# Patient Record
Sex: Female | Born: 1955 | Race: White | Hispanic: No | Marital: Married | Smoking: Former smoker
Health system: Southern US, Community
[De-identification: ages and names within clinical notes are randomized; demographics above are authoritative.]

## PROBLEM LIST (undated history)

## (undated) DIAGNOSIS — I10 Essential (primary) hypertension: Secondary | ICD-10-CM

## (undated) HISTORY — PX: CHOLECYSTECTOMY: SHX55

## (undated) HISTORY — PX: TRIGGER FINGER RELEASE: SHX641

---

## 2000-08-19 HISTORY — PX: ABDOMINAL HYSTERECTOMY: SHX81

## 2005-07-04 LAB — HM PAP SMEAR: HM PAP: NORMAL

## 2006-01-24 ENCOUNTER — Emergency Department: Payer: Self-pay | Admitting: General Practice

## 2008-08-30 ENCOUNTER — Ambulatory Visit: Payer: Self-pay | Admitting: Gastroenterology

## 2008-08-30 LAB — HM COLONOSCOPY

## 2008-11-22 ENCOUNTER — Ambulatory Visit: Payer: Self-pay | Admitting: Gastroenterology

## 2008-11-22 HISTORY — PX: UPPER GI ENDOSCOPY: SHX6162

## 2010-04-26 ENCOUNTER — Ambulatory Visit: Payer: Self-pay | Admitting: Internal Medicine

## 2010-04-27 ENCOUNTER — Inpatient Hospital Stay: Payer: Self-pay | Admitting: Surgery

## 2010-05-03 LAB — PATHOLOGY REPORT

## 2010-12-06 ENCOUNTER — Ambulatory Visit: Payer: Self-pay | Admitting: Family Medicine

## 2011-08-30 IMAGING — US ABDOMEN ULTRASOUND
1 series · 17 of 25 positions shown · non-contrast
Comparison: none

REASON FOR EXAM: CR [id number] Abd Pain Vomiting
COMMENTS:

[Series 1: abdomen ultrasound · 17 of 99 slices shown]
[im 1/99]
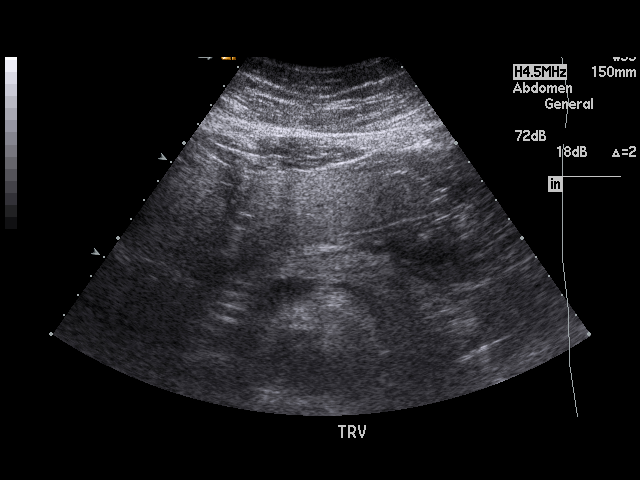
[im 9/99]
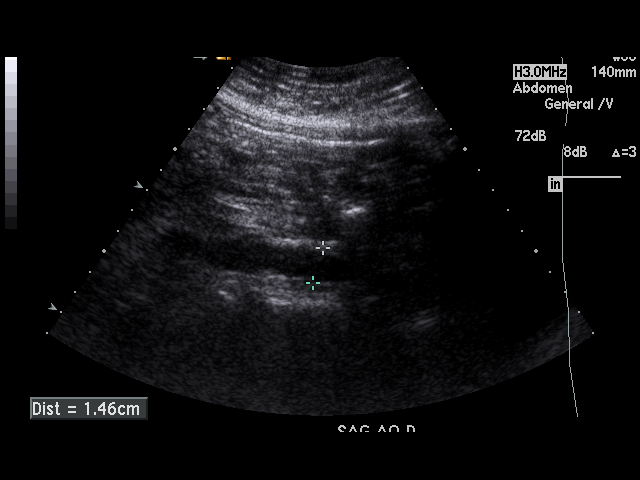
[im 13/99]
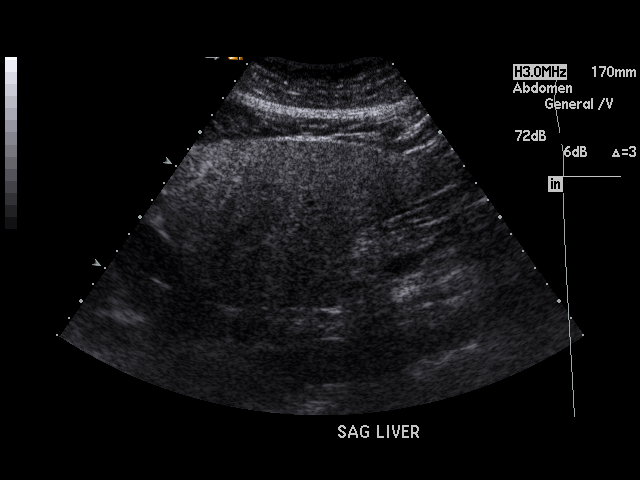
[im 21/99]
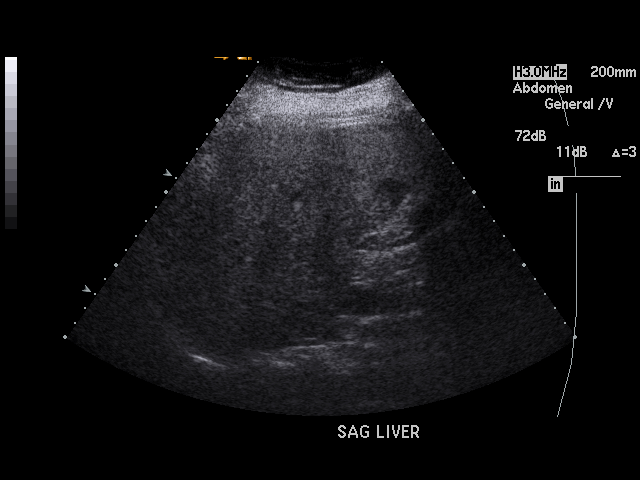
[im 25/99]
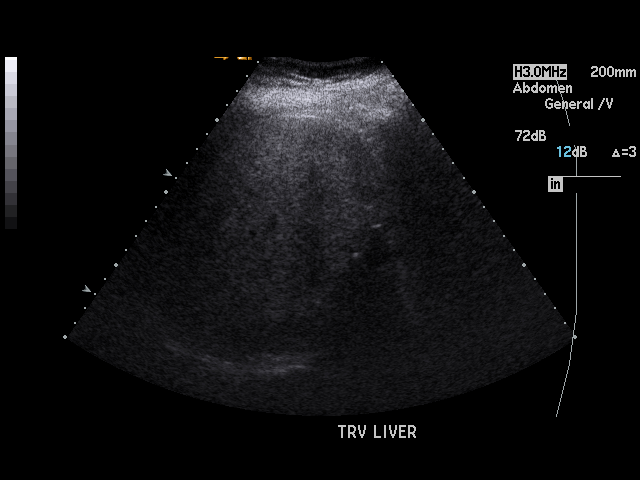
[im 33/99]
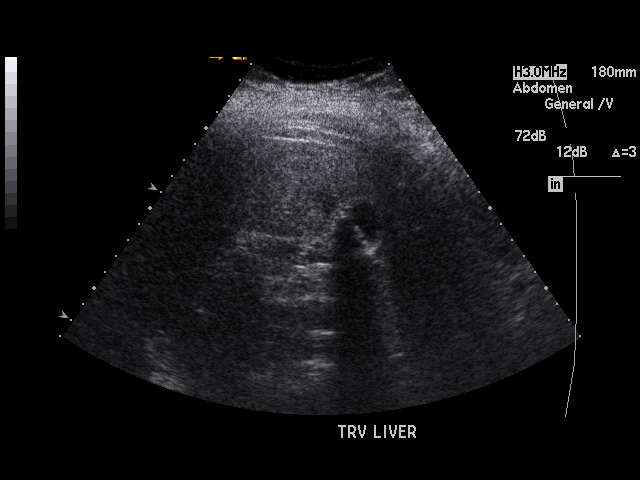
[im 37/99]
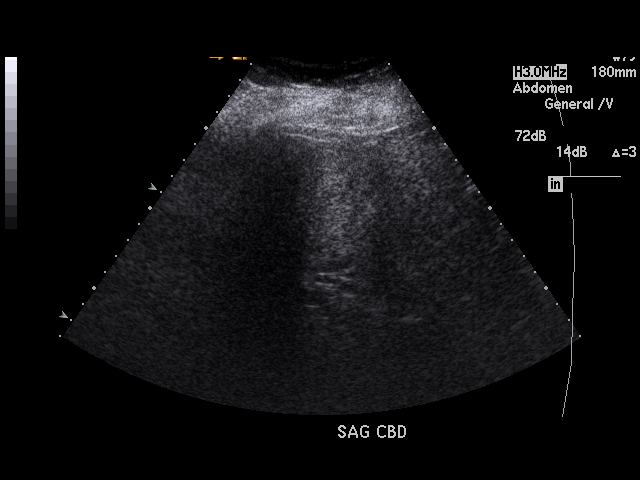
[im 45/99]
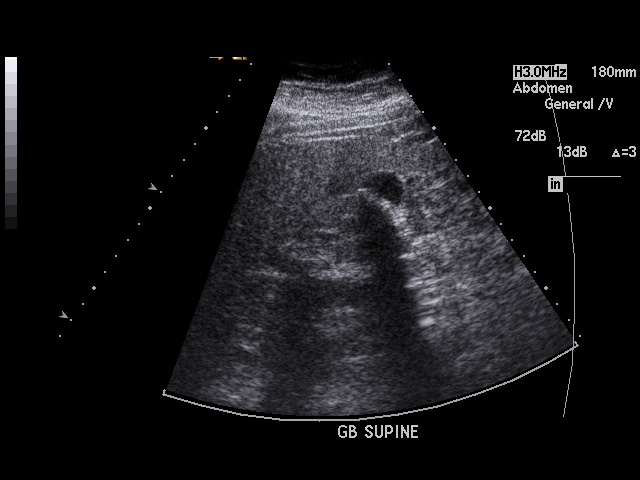
[im 50/99]
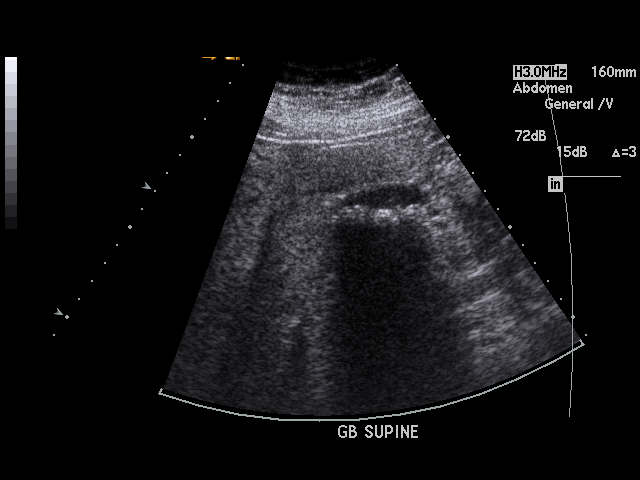
[im 54/99]
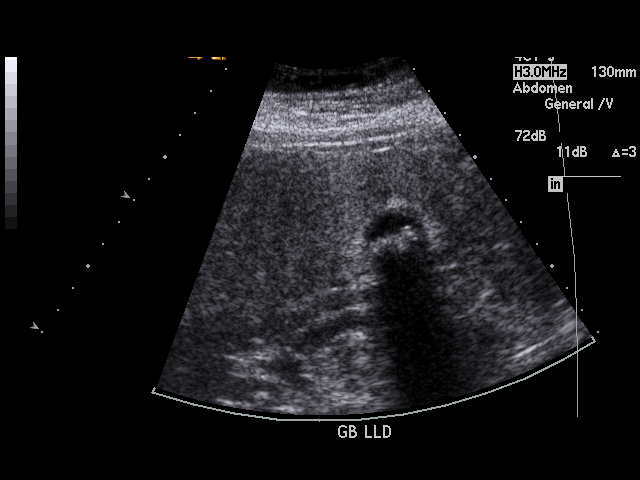
[im 62/99]
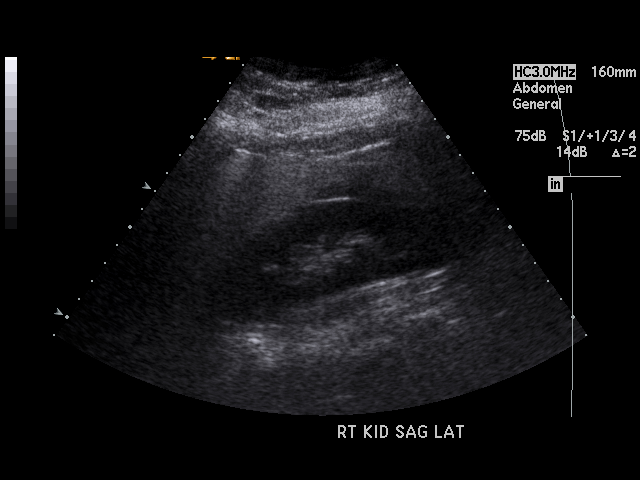
[im 66/99]
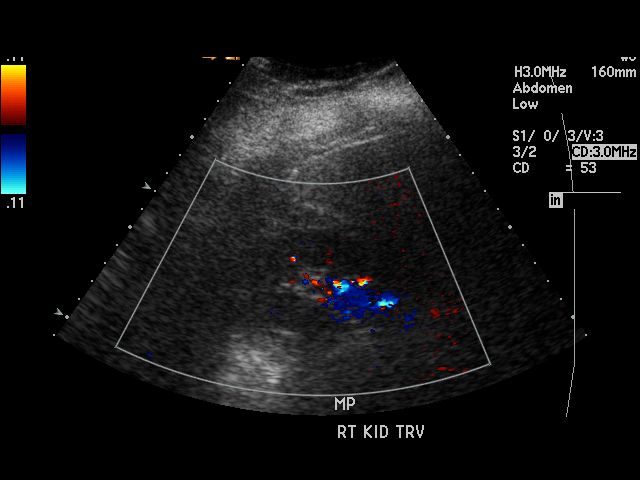
[im 74/99]
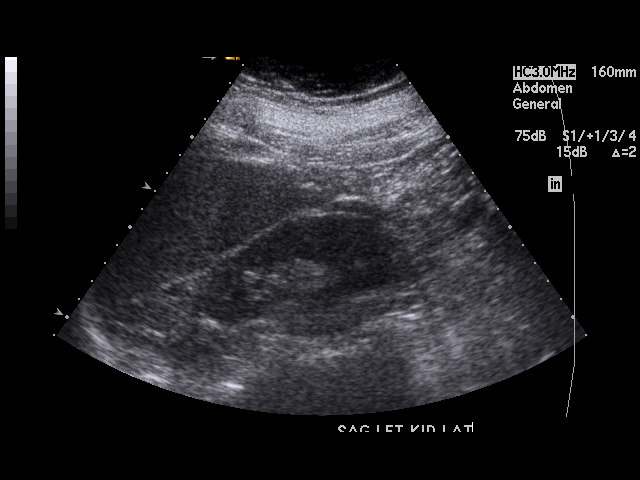
[im 78/99]
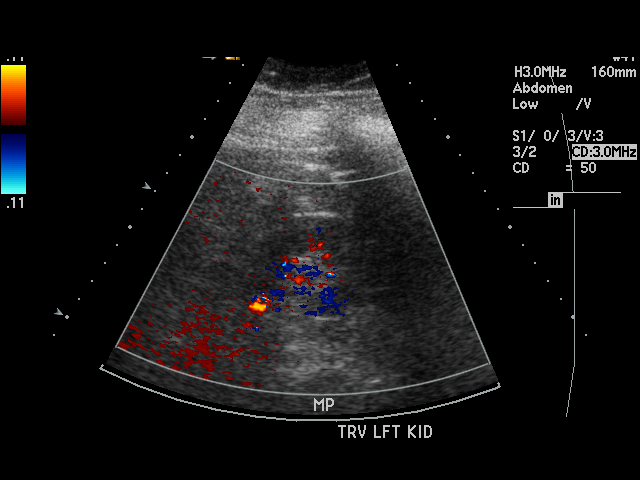
[im 86/99]
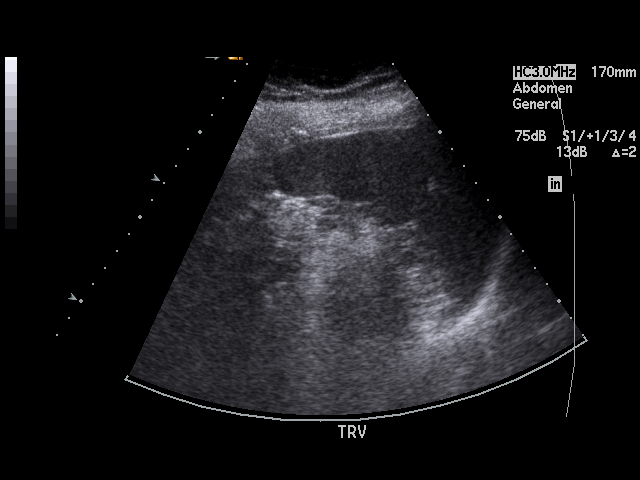
[im 90/99]
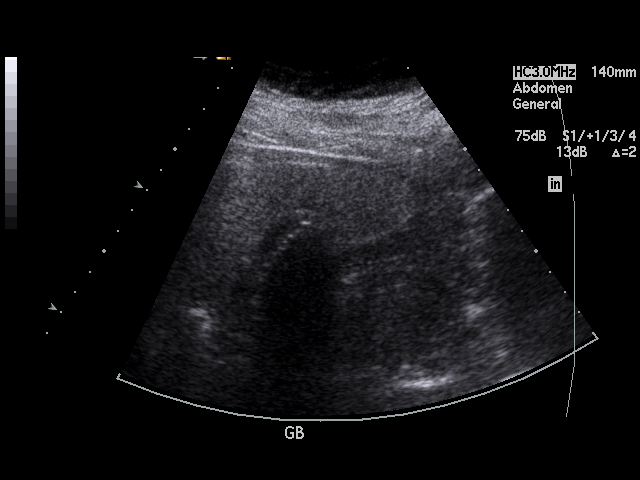
[im 99/99]
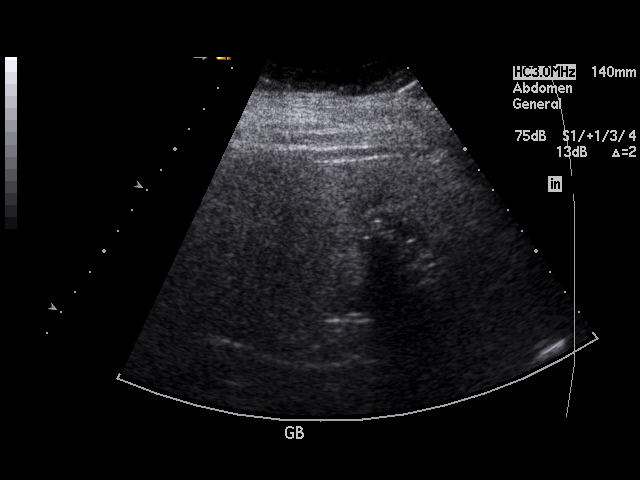

[17 of 25 positions shown; findings below may reference images not displayed]

PROCEDURE:     US  - US ABDOMEN GENERAL SURVEY  - April 26, 2010 [DATE]

RESULT:     The liver is hyperechogenic, suspicious for fatty infiltration.
The body of the pancreas is normal in appearance. The head and tail are
partially obscured by bowel gas. Spleen size is within normal limits. The
abdominal aorta and inferior vena cava show no significant abnormalities.
There are noted multiple echodensities in the gallbladder compatible with
gallstones. No thickening of the gallbladder wall is seen and no
pericholecystic fluid is identified. The common bile duct measures 4.4 mm in
diameter which is within normal limits. The kidneys show no hydronephrosis.
There is no ascites.
IMPRESSION: 1. Cholelithiasis with there being no associated thickening of the
gallbladder wall or pericholecystic fluid.
2. Probable fatty infiltration of the liver.
3. The spleen is partially obscured by bowel gas.

## 2012-11-22 ENCOUNTER — Emergency Department: Payer: Self-pay | Admitting: Emergency Medicine

## 2012-11-22 LAB — COMPREHENSIVE METABOLIC PANEL
Anion Gap: 8 (ref 7–16)
Bilirubin,Total: 1.1 mg/dL — ABNORMAL HIGH (ref 0.2–1.0)
Chloride: 105 mmol/L (ref 98–107)
EGFR (Non-African Amer.): >60
Osmolality: 281 (ref 275–301)
Potassium: 3.5 mmol/L (ref 3.5–5.1)
SGOT(AST): 112 U/L — ABNORMAL HIGH (ref 15–37)
Total Protein: 7.6 g/dL (ref 6.4–8.2)

## 2012-11-22 LAB — CBC
HCT: 39.7 % (ref 35.0–47.0)
MCH: 28.9 pg (ref 26.0–34.0)
MCHC: 34.2 g/dL (ref 32.0–36.0)
MCV: 84 fL (ref 80–100)
Platelet: 244 10*3/uL (ref 150–440)
RBC: 4.71 10*6/uL (ref 3.80–5.20)
RDW: 14.8 % — ABNORMAL HIGH (ref 11.5–14.5)
WBC: 8.4 10*3/uL (ref 3.6–11.0)

## 2012-11-22 LAB — TROPONIN I
Troponin-I: <0.02 ng/mL
Troponin-I: <0.02 ng/mL

## 2012-11-22 LAB — PROTIME-INR
INR: 1
Prothrombin Time: 13.7 secs (ref 11.5–14.7)

## 2012-11-22 LAB — APTT: Activated PTT: 23 secs — ABNORMAL LOW (ref 23.6–35.9)

## 2012-11-22 LAB — CK TOTAL AND CKMB (NOT AT ARMC): CK-MB: 0.8 ng/mL (ref 0.5–3.6)

## 2012-11-25 ENCOUNTER — Telehealth: Payer: Self-pay

## 2012-11-25 NOTE — Telephone Encounter (Signed)
Pt was seen in ED for c/o CP "Call pt to see if she wants f/u with cardiology" VO Dr. Alvis Lemmings, RN

## 2012-11-25 NOTE — Telephone Encounter (Signed)
Both #s listed have been d/c I will look in [facility] records

## 2012-11-27 NOTE — Telephone Encounter (Signed)
Pt at work, per husband

## 2014-01-06 LAB — CBC AND DIFFERENTIAL
HCT: 41 % (ref 36–46)
Hemoglobin: 14.4 g/dL (ref 12.0–16.0)
Neutrophils Absolute: 4 /uL
Platelets: 269 10*3/uL (ref 150–399)
WBC: 6.8 10^3/mL

## 2014-01-06 LAB — BASIC METABOLIC PANEL
BUN: 13 mg/dL (ref 4–21)
CREATININE: 0.9 mg/dL (ref 0.5–1.1)
GLUCOSE: 98 mg/dL
Potassium: 4.3 mmol/L (ref 3.4–5.3)
Sodium: 139 mmol/L (ref 137–147)

## 2014-01-06 LAB — LIPID PANEL
Cholesterol: 200 mg/dL (ref 0–200)
HDL: 44 mg/dL (ref 35–70)
LDL Cholesterol: 119 mg/dL
LDl/HDL Ratio: 2.7
TRIGLYCERIDES: 186 mg/dL — AB (ref 40–160)

## 2014-01-06 LAB — HEPATIC FUNCTION PANEL
ALT: 16 U/L (ref 7–35)
AST: 17 U/L (ref 13–35)
Alkaline Phosphatase: 99 U/L (ref 25–125)
Bilirubin, Total: 0.5 mg/dL

## 2014-01-06 LAB — TSH: TSH: 4.64 u[IU]/mL (ref 0.41–5.90)

## 2014-03-28 IMAGING — CR DG CHEST 1V PORT
1 series · 1 of 1 positions shown · non-contrast
Comparison: none

REASON FOR EXAM: Chest Pain
COMMENTS:

PROCEDURE:     DXR - DXR PORTABLE CHEST SINGLE VIEW  - November 22, 2012  [DATE]
RESULT:     No prior studies. Lungs clear. Heart size normal.

[ap]
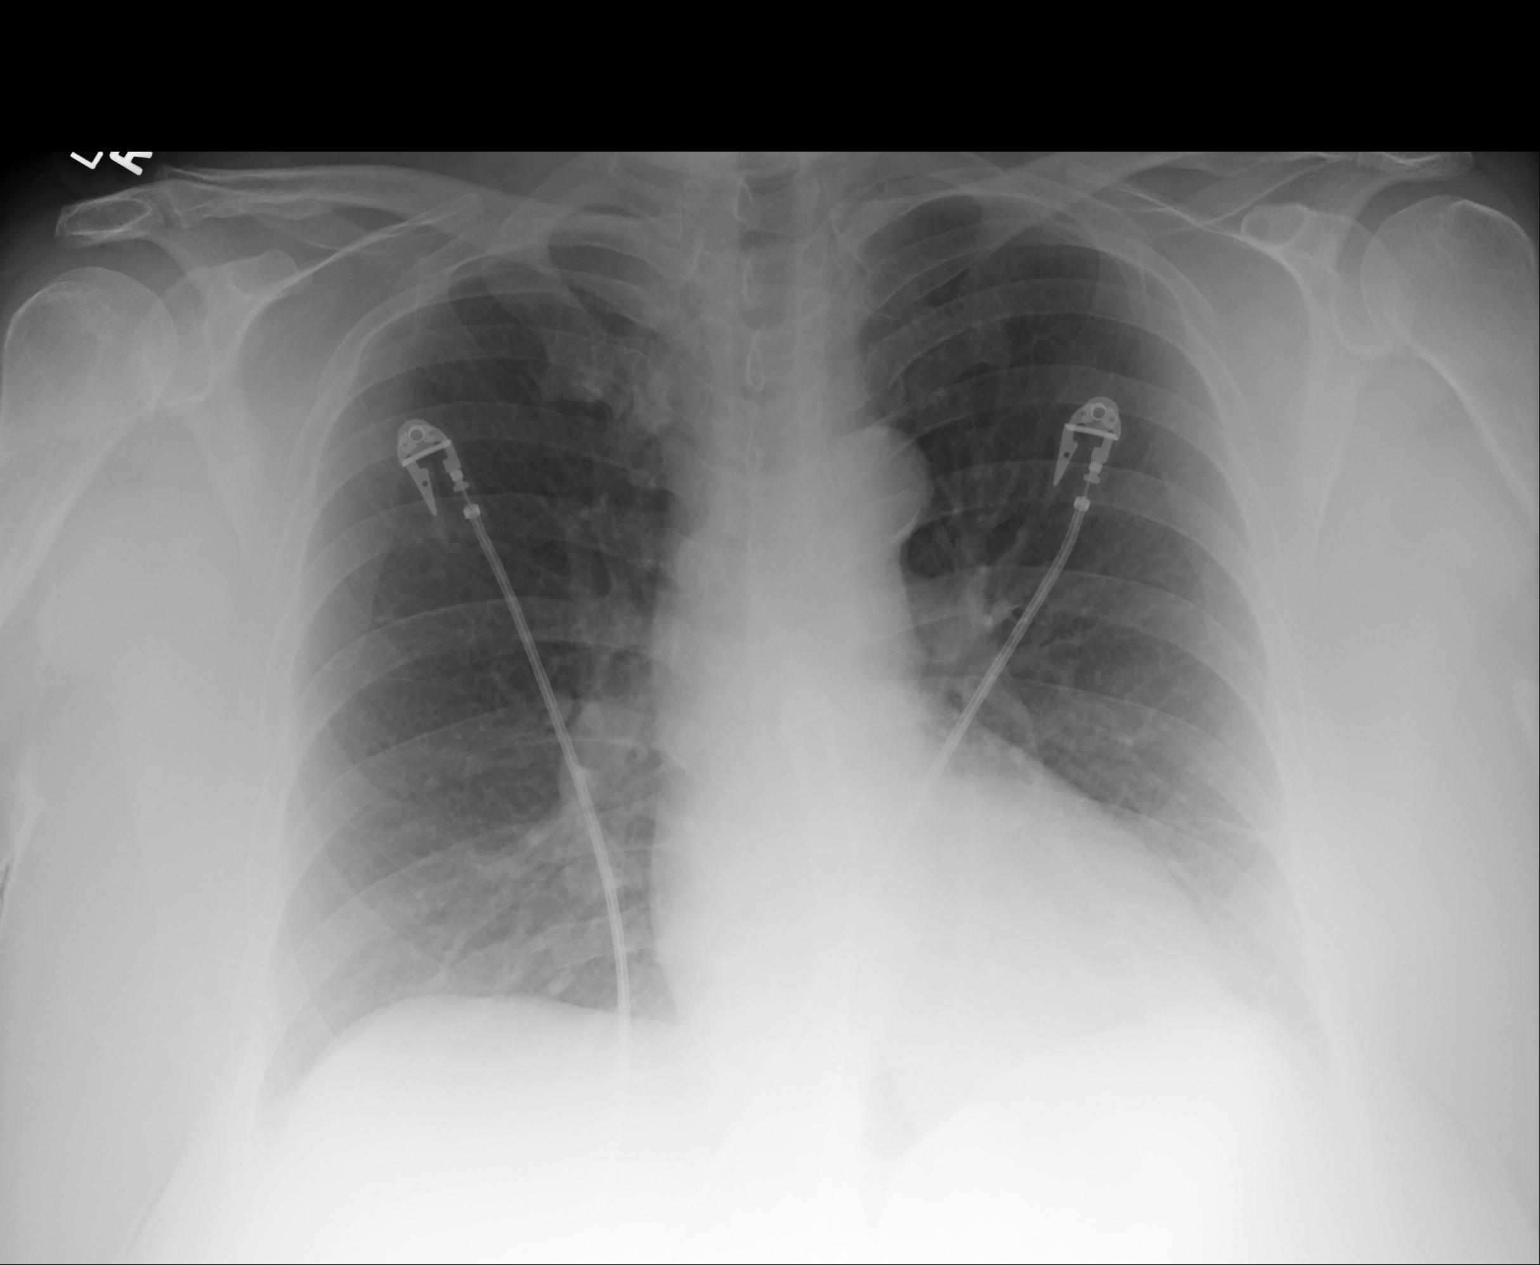

[1 of 1 positions shown; findings below may reference images not displayed]

IMPRESSION: Negative chest.

## 2015-01-18 ENCOUNTER — Other Ambulatory Visit: Payer: Self-pay | Admitting: Family Medicine

## 2015-01-19 NOTE — Telephone Encounter (Signed)
Pt contacted office for refill request on the following medications: Triamterene-HCTZ 37.5-25mg . Pt stated that the pharmacy has been trying to get this refilled since last week. Thanks TNP

## 2015-01-19 NOTE — Telephone Encounter (Signed)
Pt had LOV a year ago. The medication is in allscripts as she is taking 1/2 tab daily. See other message for other information.

## 2015-01-20 NOTE — Telephone Encounter (Signed)
Needs appt for further refills.

## 2015-01-20 NOTE — Telephone Encounter (Signed)
Pt not seen in over 1 year. Need appt fir further refills

## 2015-02-17 DIAGNOSIS — I10 Essential (primary) hypertension: Secondary | ICD-10-CM | POA: Insufficient documentation

## 2015-02-17 DIAGNOSIS — K219 Gastro-esophageal reflux disease without esophagitis: Secondary | ICD-10-CM | POA: Insufficient documentation

## 2015-02-17 DIAGNOSIS — E559 Vitamin D deficiency, unspecified: Secondary | ICD-10-CM | POA: Insufficient documentation

## 2015-02-17 DIAGNOSIS — E669 Obesity, unspecified: Secondary | ICD-10-CM | POA: Insufficient documentation

## 2015-02-17 DIAGNOSIS — K2270 Barrett's esophagus without dysplasia: Secondary | ICD-10-CM | POA: Insufficient documentation

## 2015-02-17 DIAGNOSIS — K449 Diaphragmatic hernia without obstruction or gangrene: Secondary | ICD-10-CM | POA: Insufficient documentation

## 2015-02-17 DIAGNOSIS — J309 Allergic rhinitis, unspecified: Secondary | ICD-10-CM | POA: Insufficient documentation

## 2015-04-13 ENCOUNTER — Ambulatory Visit (INDEPENDENT_AMBULATORY_CARE_PROVIDER_SITE_OTHER): Payer: BC Managed Care – PPO | Admitting: Family Medicine

## 2015-04-13 ENCOUNTER — Encounter: Payer: Self-pay | Admitting: Family Medicine

## 2015-04-13 VITALS — BP 138/90 | HR 78 | Temp 98.4°F | Resp 12 | Ht 65.0 in | Wt 257.0 lb

## 2015-04-13 DIAGNOSIS — Z0000 Encounter for general adult medical examination without abnormal findings: Secondary | ICD-10-CM | POA: Diagnosis not present

## 2015-04-13 DIAGNOSIS — Z1239 Encounter for other screening for malignant neoplasm of breast: Secondary | ICD-10-CM | POA: Diagnosis not present

## 2015-04-13 DIAGNOSIS — Z124 Encounter for screening for malignant neoplasm of cervix: Secondary | ICD-10-CM

## 2015-04-13 DIAGNOSIS — Z1211 Encounter for screening for malignant neoplasm of colon: Secondary | ICD-10-CM

## 2015-04-13 LAB — IFOBT (OCCULT BLOOD): IFOBT: NEGATIVE

## 2015-04-13 MED ORDER — OMEPRAZOLE 20 MG PO CPDR: 20.0000 mg | DELAYED_RELEASE_CAPSULE | Freq: Every day | ORAL | Status: DC

## 2015-04-13 MED ORDER — TRIAMTERENE-HCTZ 37.5-25 MG PO TABS: 1.0000 | ORAL_TABLET | Freq: Every day | ORAL | Status: DC

## 2015-04-13 NOTE — Progress Notes (Signed)
Patient ID: Linda Hoffman, female   DOB: 05-Nov-1955, 59 y.o.   MRN: [id number] Visit Date: 04/13/2015  Today's Provider: Wilhemena Durie, MD   Chief Complaint  Patient presents with  . Annual Exam   Subjective:  Linda Hoffman is a 59 y.o. female who presents today for health maintenance and complete physical. She feels well. She reports exercising not enough patient states. She reports she is sleeping well. Patient reports that she does not know why she can't lose weight since she quit smoking. Then in conversation she admits that she did lose weight when doing weight watchers or [facility] lifestyles program LAST: Routine labs 01/06/14  EKG-10/07/12  Endoscopy-11/22/08  Colonoscopy-08/30/08  BMD-normal 06/23/08  Tdap-10/07/12  Mammogram-normal 12/2013 per patient  Pap smear-unsure of the exact day-she had total hysterectomy due to endometriosis and fibroid tumors.  Review of Systems  Constitutional: Negative.   HENT: Negative.   Eyes: Negative.   Respiratory: Negative.   Cardiovascular: Negative.   Gastrointestinal: Negative.   Endocrine: Negative.   Genitourinary: Negative.   Musculoskeletal: Negative.   Skin: Negative.   Allergic/Immunologic: Negative.   Neurological: Negative.   Hematological: Negative.   Psychiatric/Behavioral: Negative.     Social History   Social History  . Marital Status: Married    Spouse Name: N/A  . Number of Children: N/A  . Years of Education: N/A   Occupational History  . Not on file.   Social History Main Topics  . Smoking status: Former Smoker -- 0.50 packs/day for 20 years    Types: Cigarettes    Quit date: 08/19/2008  . Smokeless tobacco: Never Used  . Alcohol Use: No  . Drug Use: No  . Sexual Activity: Yes   Other Topics Concern  . Not on file   Social History Narrative    Patient Active Problem List   Diagnosis Date Noted  . Allergic rhinitis 02/17/2015  . Barrett's esophagus 02/17/2015  . Acid reflux 02/17/2015  .  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 BP (high blood pressure) 02/17/2015  . Adiposity 02/17/2015  . Avitaminosis D 02/17/2015    Past Surgical History  Procedure Laterality Date  . Cholecystectomy    . Trigger finger release    . Abdominal hysterectomy  2002    total-BSO  . Upper gi endoscopy  11/22/08    normal    Her family history includes Diabetes in her brother, father, mother, and sister; Esophageal cancer in her maternal grandfather; Heart disease in her father; Hyperlipidemia in her mother; Hypertension in her brother, brother, mother, and sister; Kidney failure in her mother; Liver cancer in her paternal grandfather; Multiple myeloma in her father; Thyroid cancer in her brother; Thyroid disease in her mother and sister.    Outpatient Prescriptions Prior to Visit  Medication Sig Dispense Refill  . aspirin 81 MG tablet Take by mouth.    . Cholecalciferol (VITAMIN D) 2000 UNITS tablet Take by mouth.    . loratadine (CLARITIN) 10 MG tablet Take by mouth.    Marland Kitchen omeprazole (PRILOSEC) 20 MG capsule Take by mouth.    . TRIAMCINOLONE ACETONIDE, NASAL STEROIDS, Place into the nose.    . triamterene-hydrochlorothiazide (MAXZIDE-25) 37.5-25 MG per tablet 1/2 TABLET BY MOUTH DAILY 30 tablet 0   No facility-administered medications prior to visit.    Patient Care Team: Jerrol Banana., MD as PCP - General (Family Medicine)     Objective:   Vitals:  Filed Vitals:   04/13/15 0922  BP:  138/90  Pulse: 78  Temp: 98.4 F (36.9 C)  Resp: 12  Height: $Remove'5\' 5"'qGbGyKc$  (1.651 m)  Weight: 257 lb (116.574 kg)    Physical Exam  Constitutional: She is oriented to person, place, and time. She appears well-developed and well-nourished.  Obese white female in no acute distress.  HENT:  Head: Normocephalic and atraumatic.  Right Ear: External ear normal.  Left Ear: External ear normal.  Nose: Nose normal.  Mouth/Throat: Oropharynx is clear and moist.  Eyes: Conjunctivae are normal.  Neck:  Normal range of motion. Neck supple.  Cardiovascular: Normal rate, regular rhythm, normal heart sounds and intact distal pulses.   Pulmonary/Chest: Effort normal and breath sounds normal.  Abdominal: Soft.  Genitourinary: Vagina normal.  Mild cystocele and rectocele noted. Bimanual exam benign. DRE normal with negative OC light.  Musculoskeletal: She exhibits edema.  1+ lower extremity edema  Neurological: She is alert and oriented to person, place, and time.  Skin: Skin is warm and dry.  Psychiatric: She has a normal mood and affect. Her behavior is normal. Thought content normal.     Depression Screen PHQ 2/9 Scores 04/13/2015  PHQ - 2 Score 0   Epworth-3    Assessment & Plan:   Health maintenance Pap/HPV, OC light obtained. Patient will arrange mammography BMD age 52, colonoscopy 2020. Obesity She is strongly encouraged to try [facility] or weight watcher's. She does not seem to have OSA.

## 2015-04-14 LAB — CBC WITH DIFFERENTIAL/PLATELET
BASOS ABS: 0.1 10*3/uL (ref 0.0–0.2)
Basos: 2 %
EOS (ABSOLUTE): 0.3 10*3/uL (ref 0.0–0.4)
Eos: 5 %
Hematocrit: 41.5 % (ref 34.0–46.6)
Hemoglobin: 13.7 g/dL (ref 11.1–15.9)
IMMATURE GRANULOCYTES: 0 %
Immature Grans (Abs): 0 10*3/uL (ref 0.0–0.1)
LYMPHS ABS: 2 10*3/uL (ref 0.7–3.1)
Lymphs: 33 %
MCH: 27.9 pg (ref 26.6–33.0)
MCHC: 33 g/dL (ref 31.5–35.7)
MCV: 85 fL (ref 79–97)
MONOS ABS: 0.4 10*3/uL (ref 0.1–0.9)
Monocytes: 6 %
Neutrophils Absolute: 3.4 10*3/uL (ref 1.4–7.0)
Neutrophils: 54 %
PLATELETS: 265 10*3/uL (ref 150–379)
RBC: 4.91 x10E6/uL (ref 3.77–5.28)
RDW: 15.6 % — ABNORMAL HIGH (ref 12.3–15.4)
WBC: 6.1 10*3/uL (ref 3.4–10.8)

## 2015-04-14 LAB — LIPID PANEL WITH LDL/HDL RATIO
CHOLESTEROL TOTAL: 181 mg/dL (ref 100–199)
HDL: 45 mg/dL (ref >39)
LDL Calculated: 107 mg/dL — ABNORMAL HIGH (ref 0–99)
LDl/HDL Ratio: 2.4 ratio units (ref 0.0–3.2)
TRIGLYCERIDES: 144 mg/dL (ref 0–149)
VLDL Cholesterol Cal: 29 mg/dL (ref 5–40)

## 2015-04-14 LAB — COMPREHENSIVE METABOLIC PANEL
A/G RATIO: 1.5 (ref 1.1–2.5)
ALT: 27 IU/L (ref 0–32)
AST: 20 IU/L (ref 0–40)
Albumin: 4.3 g/dL (ref 3.5–5.5)
Alkaline Phosphatase: 89 IU/L (ref 39–117)
BILIRUBIN TOTAL: 0.5 mg/dL (ref 0.0–1.2)
BUN/Creatinine Ratio: 16 (ref 9–23)
BUN: 14 mg/dL (ref 6–24)
CALCIUM: 9.5 mg/dL (ref 8.7–10.2)
CHLORIDE: 98 mmol/L (ref 97–108)
CO2: 26 mmol/L (ref 18–29)
Creatinine, Ser: 0.89 mg/dL (ref 0.57–1.00)
GFR, EST AFRICAN AMERICAN: 84 mL/min/{1.73_m2} (ref >59)
GFR, EST NON AFRICAN AMERICAN: 73 mL/min/{1.73_m2} (ref >59)
GLOBULIN, TOTAL: 2.9 g/dL (ref 1.5–4.5)
Glucose: 104 mg/dL — ABNORMAL HIGH (ref 65–99)
POTASSIUM: 4.4 mmol/L (ref 3.5–5.2)
SODIUM: 137 mmol/L (ref 134–144)
Total Protein: 7.2 g/dL (ref 6.0–8.5)

## 2015-04-14 LAB — TSH: TSH: 4.37 u[IU]/mL (ref 0.450–4.500)

## 2015-04-16 LAB — PAP IG W/ RFLX HPV ASCU: PAP Smear Comment: 0

## 2015-04-19 ENCOUNTER — Encounter: Payer: Self-pay | Admitting: Family Medicine

## 2015-06-18 ENCOUNTER — Other Ambulatory Visit: Payer: Self-pay | Admitting: Family Medicine

## 2016-04-03 ENCOUNTER — Other Ambulatory Visit: Payer: Self-pay | Admitting: Family Medicine

## 2016-04-03 DIAGNOSIS — Z0000 Encounter for general adult medical examination without abnormal findings: Secondary | ICD-10-CM

## 2016-04-03 DIAGNOSIS — I10 Essential (primary) hypertension: Secondary | ICD-10-CM

## 2016-04-03 NOTE — Telephone Encounter (Signed)
Will not fill again without appointment

## 2016-04-03 NOTE — Telephone Encounter (Signed)
LOV 04/13/2015. Does not have another OV scheduled. Allene DillonEmily Drozdowski, CMA

## 2016-04-03 NOTE — Telephone Encounter (Signed)
LOV 04/13/2015 without OV scheduled. Allene DillonEmily Drozdowski, CMA

## 2016-08-22 ENCOUNTER — Encounter: Payer: Self-pay | Admitting: Emergency Medicine

## 2016-08-22 ENCOUNTER — Ambulatory Visit: Payer: BC Managed Care – PPO | Admitting: Family Medicine

## 2016-08-22 ENCOUNTER — Emergency Department
Admission: EM | Admit: 2016-08-22 | Discharge: 2016-08-22 | Disposition: A | Discharge status: Home / Self Care | Payer: BC Managed Care – PPO | Attending: Student in an Organized Health Care Education/Training Program | Admitting: Student in an Organized Health Care Education/Training Program

## 2016-08-22 DIAGNOSIS — Z87891 Personal history of nicotine dependence: Secondary | ICD-10-CM | POA: Insufficient documentation

## 2016-08-22 DIAGNOSIS — R040 Epistaxis: Secondary | ICD-10-CM

## 2016-08-22 DIAGNOSIS — I10 Essential (primary) hypertension: Secondary | ICD-10-CM | POA: Diagnosis not present

## 2016-08-22 HISTORY — DX: Essential (primary) hypertension: I10

## 2016-08-22 LAB — COMPREHENSIVE METABOLIC PANEL
ALBUMIN: 4.1 g/dL (ref 3.5–5.0)
ALT: 28 U/L (ref 14–54)
AST: 28 U/L (ref 15–41)
Alkaline Phosphatase: 87 U/L (ref 38–126)
Anion gap: 10 (ref 5–15)
BUN: 17 mg/dL (ref 6–20)
CHLORIDE: 103 mmol/L (ref 101–111)
CO2: 24 mmol/L (ref 22–32)
Calcium: 9.4 mg/dL (ref 8.9–10.3)
Creatinine, Ser: 0.82 mg/dL (ref 0.44–1.00)
GFR calc Af Amer: >60 mL/min (ref >60)
Glucose, Bld: 137 mg/dL — ABNORMAL HIGH (ref 65–99)
POTASSIUM: 3.5 mmol/L (ref 3.5–5.1)
Sodium: 137 mmol/L (ref 135–145)
Total Bilirubin: 0.6 mg/dL (ref 0.3–1.2)
Total Protein: 8 g/dL (ref 6.5–8.1)

## 2016-08-22 LAB — CBC
HEMATOCRIT: 43.4 % (ref 35.0–47.0)
HEMOGLOBIN: 14.6 g/dL (ref 12.0–16.0)
MCH: 28.5 pg (ref 26.0–34.0)
MCHC: 33.6 g/dL (ref 32.0–36.0)
MCV: 84.8 fL (ref 80.0–100.0)
Platelets: 273 10*3/uL (ref 150–440)
RBC: 5.12 MIL/uL (ref 3.80–5.20)
RDW: 15.2 % — ABNORMAL HIGH (ref 11.5–14.5)
WBC: 8.8 10*3/uL (ref 3.6–11.0)

## 2016-08-22 MED ORDER — OXYMETAZOLINE HCL 0.05 % NA SOLN
1.0000 | Freq: Once | NASAL | Status: AC
  Administered 2016-08-22: 1 via NASAL
  Filled 2016-08-22: qty 15

## 2016-08-22 MED ORDER — LIDOCAINE-EPINEPHRINE 2 %-1:100000 IJ SOLN
30.0000 mL | Freq: Once | INTRAMUSCULAR | Status: AC
  Administered 2016-08-22: 30 mL
  Filled 2016-08-22: qty 30

## 2016-08-22 MED ORDER — CLINDAMYCIN HCL 300 MG PO CAPS: 300.0000 mg | ORAL_CAPSULE | Freq: Three times a day (TID) | ORAL | 0 refills | Status: AC

## 2016-08-22 MED ORDER — SILVER NITRATE-POT NITRATE 75-25 % EX MISC
1.0000 "application " | Freq: Once | CUTANEOUS | Status: AC
  Administered 2016-08-22: 1 via TOPICAL
  Filled 2016-08-22: qty 1

## 2016-08-22 NOTE — ED Triage Notes (Addendum)
Pt started with nose bleed yesterday and it stopped then started again at 1530 today and has not stopped. Pt reports holding pressure since then but it keeps bleeding. Draining down posterior sinus, pt is spitting blood out. No blood thinners. Hypertensive in triage. Reports bp yesterday was 140/80. Reports lost a lot of blood. Denies weakness or dizziness.

## 2016-08-22 NOTE — ED Notes (Signed)
Pt states yest she had a right nostril nose bleed, throughout the night her nose seemed to stop bleeding. However this morning around 10:30 she stated the bloody nose returned to both nostrils, however soon stopped. Pt states she called her Dr for an appt and when she was getting ready around 1515 her nose began to bleed again. Pt states the bleeding was a lot prior to coming to this ER, pt's husband states "steady flow."  Pt denies being on blood thinners, reports does take low dose aspirin with BP meds however did not take it today. Pt denies inj to nares, headache, cough or high blood pressure. Pt reports she can feel the blood pass through her "sinuses before spitting out blood". Pt A&O at this time.

## 2016-08-22 NOTE — ED Provider Notes (Signed)
[facility] Emergency Department Provider Note    First MD Initiated Contact with Patient 08/22/16 1830     (approximate)  I have reviewed the triage vital signs and the nursing notes.   HISTORY  Chief Complaint Epistaxis    HPI Linda Hoffman is a 61 y.o. female presents with epistaxis. No history of trauma. States that it has been going on and off for the past day. States initially started coming out of the right naris. She is not on any blood thinners. No recent fevers. No instrumentation or previous surgeries or nose. States that as the bleeding increased feels it is coming out of both noses. Denies any lightheadedness, shortness of breath or chest pain.   Past Medical History:  Diagnosis Date  . Hypertension    Family History  Problem Relation Age of Onset  . Hypertension Mother   . Hyperlipidemia Mother   . Diabetes Mother   . Kidney failure Mother   . Thyroid disease Mother   . Diabetes Father   . Heart disease Father   . Multiple myeloma Father   . Diabetes Sister   . Hypertension Sister   . Diabetes Brother   . Hypertension Brother   . Thyroid cancer Brother   . Hypertension Brother   . Thyroid disease Sister   . Esophageal cancer Maternal Grandfather   . Liver cancer Paternal Grandfather    Past Surgical History:  Procedure Laterality Date  . ABDOMINAL HYSTERECTOMY  2002   total-BSO  . CHOLECYSTECTOMY    . TRIGGER FINGER RELEASE    . UPPER GI ENDOSCOPY  11/22/08   normal   Patient Active Problem List   Diagnosis Date Noted  . Allergic rhinitis 02/17/2015  . Barrett's esophagus 02/17/2015  . Acid reflux 02/17/2015  . 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 BP (high blood pressure) 02/17/2015  . Adiposity 02/17/2015  . Avitaminosis D 02/17/2015      Prior to Admission medications   Medication Sig Start Date End Date Taking? Authorizing Provider  aspirin 81 MG tablet Take by mouth. 04/07/13   Historical Provider, MD    Cholecalciferol (VITAMIN D) 2000 UNITS tablet Take by mouth. 04/07/12   Historical Provider, MD  loratadine (CLARITIN) 10 MG tablet Take by mouth. 04/07/13   Historical Provider, MD  omeprazole (PRILOSEC) 20 MG capsule 1 CAPSULE DR, ORAL, TWO TIMES DAILY 06/19/15   Jerrol Banana., MD  TRIAMCINOLONE ACETONIDE, NASAL STEROIDS, Place into the nose. 04/07/13   Historical Provider, MD  triamterene-hydrochlorothiazide (MAXZIDE-25) 37.5-25 MG tablet TAKE 1 TABLET BY MOUTH DAILY. 04/03/16   Richard Maceo Pro., MD    Allergies Ceftin  [cefuroxime axetil]; Doxycycline hyclate; Lodine  [etodolac]; Progesterone; and Augmentin  [amoxicillin-pot clavulanate]    Social History Social History  Substance Use Topics  . Smoking status: Former Smoker    Packs/day: 0.50    Years: 20.00    Types: Cigarettes    Quit date: 08/19/2008  . Smokeless tobacco: Never Used  . Alcohol use No    Review of Systems Patient denies headaches, rhinorrhea, blurry vision, numbness, shortness of breath, chest pain, edema, cough, abdominal pain, nausea, vomiting, diarrhea, dysuria, fevers, rashes or hallucinations unless otherwise stated above in HPI. ____________________________________________   PHYSICAL EXAM:  VITAL SIGNS: Vitals:   08/22/16 1730 08/22/16 1730  BP: (!) 174/100   Pulse:    Resp:    Temp:  98.7 F (37.1 C)    Constitutional: Alert and oriented. Holding pressure  to nose with multiple of vomiting back filled with blind tinged tissues. No active hemorrhage at this time Eyes: Conjunctivae are normal. PERRL. EOMI. Head: Atraumatic. Nose: Bilateral epistaxis coming from anterior nose. Mouth/Throat: Mucous membranes are moist.  Posterior oropharynx with small trickle of blood but no evidence of active hemorrhage. Neck: No stridor. Painless ROM. No cervical spine tenderness to palpation Hematological/Lymphatic/Immunilogical: No cervical lymphadenopathy. Cardiovascular: Normal rate, regular  rhythm. Grossly normal heart sounds.  Good peripheral circulation. Respiratory: Normal respiratory effort.  No retractions. Lungs CTAB. Gastrointestinal: Soft and nontender. No distention. No abdominal bruits. No CVA tenderness. Musculoskeletal: No lower extremity tenderness nor edema.  No joint effusions. Neurologic:  Normal speech and language. No gross focal neurologic deficits are appreciated. No gait instability. Skin:  Skin is warm, dry and intact. No rash noted. Psychiatric: Mood and affect are normal. Speech and behavior are normal.  ____________________________________________   LABS (all labs ordered are listed, but only abnormal results are displayed)  Results for orders placed or performed during the hospital encounter of 08/22/16 (from the past 24 hour(s))  CBC     Status: Abnormal   Collection Time: 08/22/16  5:30 PM  Result Value Ref Range   WBC 8.8 3.6 - 11.0 K/uL   RBC 5.12 3.80 - 5.20 MIL/uL   Hemoglobin 14.6 12.0 - 16.0 g/dL   HCT 43.4 35.0 - 47.0 %   MCV 84.8 80.0 - 100.0 fL   MCH 28.5 26.0 - 34.0 pg   MCHC 33.6 32.0 - 36.0 g/dL   RDW 15.2 (H) 11.5 - 14.5 %   Platelets 273 150 - 440 K/uL  Comprehensive metabolic panel     Status: Abnormal   Collection Time: 08/22/16  5:30 PM  Result Value Ref Range   Sodium 137 135 - 145 mmol/L   Potassium 3.5 3.5 - 5.1 mmol/L   Chloride 103 101 - 111 mmol/L   CO2 24 22 - 32 mmol/L   Glucose, Bld 137 (H) 65 - 99 mg/dL   BUN 17 6 - 20 mg/dL   Creatinine, Ser 0.82 0.44 - 1.00 mg/dL   Calcium 9.4 8.9 - 10.3 mg/dL   Total Protein 8.0 6.5 - 8.1 g/dL   Albumin 4.1 3.5 - 5.0 g/dL   AST 28 15 - 41 U/L   ALT 28 14 - 54 U/L   Alkaline Phosphatase 87 38 - 126 U/L   Total Bilirubin 0.6 0.3 - 1.2 mg/dL   GFR calc non Af Amer >60 >60 mL/min   GFR calc Af Amer >60 >60 mL/min   Anion gap 10 5 - 15    ____________________________________________ ____________________________________________  RADIOLOGY   ____________________________________________   PROCEDURES  Procedure(s) performed:  .Epistaxis Management Date/Time: 08/23/2016 12:36 AM Performed by: Merlyn Lot Authorized by: Merlyn Lot   Consent:    Consent obtained:  Verbal   Consent given by:  Patient   Risks discussed:  Bleeding, infection and nasal injury Procedure details:    Treatment site:  R anterior   Repair method: rhinorocket anterior packet.   Treatment episode: initial   Post-procedure details:    Assessment:  Bleeding stopped   Patient tolerance of procedure:  Tolerated well, no immediate complications      Critical Care performed: no ____________________________________________   INITIAL IMPRESSION / ASSESSMENT AND PLAN / ED COURSE  Pertinent labs & imaging results that were available during my care of the patient were reviewed by me and considered in my medical decision making (see chart for  details).  DDX: anterior bleed, posterior bleed, trauma  Linda Hoffman is a 61 y.o. who presents to the ED with acute epistaxis. Patient with stable vital signs. No evidence of acute symptomatic blood loss anemia. The bleeding does appear to be coming from the anterior aspect no evidence of posterior bleed. Plan Afrin with lidocaine and epinephrine packing to evaluate for source of bleeding with possible cauterization.  Clinical Course as of Aug 23 33  Thu Aug 22, 2016  2014 Anterior nasal polyps cauterized with appropriate hemostasis.   Patient had several sneezing episodes after with continued hemostasis. No posterior bleeding. Patient remains he mechanically stable without any signs or symptoms of acute blood loss anemia. Patient stable for discharge for close follow-up.  [PR]  2104 While going out the parking lot patient had recurrence of epistaxis out of the right naris. Rhino Rocket  placed as described above. Patient tolerated procedure was observed for an additional 15 minutes prior to discharge. Patient discharged with a prescription for antibiotics and follow-up with ENT for removal in 5 days. [PR]    Clinical Course User Index [PR] Merlyn Lot, MD     ____________________________________________   FINAL CLINICAL IMPRESSION(S) / ED DIAGNOSES  Final diagnoses:  Acute anterior epistaxis      NEW MEDICATIONS STARTED DURING THIS VISIT:  New Prescriptions   No medications on file     Note:  This document was prepared using Dragon voice recognition software and may include unintentional dictation errors.    Merlyn Lot, MD 08/23/16 [phone]

## 2016-08-22 NOTE — ED Notes (Signed)
ED Provider at bedside. 

## 2016-08-22 NOTE — ED Notes (Signed)
Pt refused BP being taken at this time for DC.

## 2016-08-22 NOTE — ED Notes (Signed)
ENT cart placed in room.

## 2016-08-22 NOTE — ED Notes (Signed)
Pt was DC'd and walked to front desk where she states she felt the bleeding start again. First nurse called this nurse and was told to come back to same room.   Currently pt in room with right nostril bleed.

## 2016-08-22 NOTE — ED Triage Notes (Addendum)
Bleeding seems to be stopping. Nose clamp remains in place but pt does not feel posterior bleeding. bp coming down as pt relaxes.

## 2017-02-20 ENCOUNTER — Other Ambulatory Visit: Payer: Self-pay | Admitting: Family Medicine

## 2017-02-20 DIAGNOSIS — I10 Essential (primary) hypertension: Secondary | ICD-10-CM

## 2017-02-20 MED ORDER — TRIAMTERENE-HCTZ 37.5-25 MG PO TABS: 1.0000 | ORAL_TABLET | Freq: Every day | ORAL | 5 refills | Status: DC

## 2017-02-24 ENCOUNTER — Ambulatory Visit (INDEPENDENT_AMBULATORY_CARE_PROVIDER_SITE_OTHER): Payer: BC Managed Care – PPO | Admitting: Family Medicine

## 2017-02-24 VITALS — BP 172/98 | HR 74 | Temp 97.9°F | Resp 16 | Wt 267.0 lb

## 2017-02-24 DIAGNOSIS — I10 Essential (primary) hypertension: Secondary | ICD-10-CM

## 2017-02-24 DIAGNOSIS — N6311 Unspecified lump in the right breast, upper outer quadrant: Secondary | ICD-10-CM

## 2017-02-24 MED ORDER — AMLODIPINE BESYLATE 5 MG PO TABS: 5.0000 mg | ORAL_TABLET | Freq: Every day | ORAL | 3 refills | Status: DC

## 2017-02-24 NOTE — Progress Notes (Signed)
Linda Hoffman  MRN: [id number] DOB: 08/29/1955  Subjective:  HPI   The patient is a 61 year old female who presents for evaluation of a possible left breast lump. She noticed this a few weeks ago.  She states that the lump is less noticeable now than it was when she first noticed it.  She has not had any pain, redness or drainage.  Her last mammogram was in 2016, she did not have one last year.  Patient Active Problem List   Diagnosis Date Noted  . Allergic rhinitis 02/17/2015  . Barrett's esophagus 02/17/2015  . Acid reflux 02/17/2015  . 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 BP (high blood pressure) 02/17/2015  . Adiposity 02/17/2015  . Avitaminosis D 02/17/2015    Past Medical History:  Diagnosis Date  . Hypertension     Social History   Social History  . Marital status: Married    Spouse name: N/A  . Number of children: N/A  . Years of education: N/A   Occupational History  . Not on file.   Social History Main Topics  . Smoking status: Former Smoker    Packs/day: 0.50    Years: 20.00    Types: Cigarettes    Quit date: 08/19/2008  . Smokeless tobacco: Never Used  . Alcohol use No  . Drug use: No  . Sexual activity: Yes   Other Topics Concern  . Not on file   Social History Narrative  . No narrative on file    Outpatient Encounter Prescriptions as of 02/24/2017  Medication Sig Note  . aspirin 81 MG tablet Take by mouth. 02/17/2015: Received from: [facility]  . Cholecalciferol (VITAMIN D) 2000 UNITS tablet Take by mouth. 02/17/2015: Received from: [facility]  . loratadine (CLARITIN) 10 MG tablet Take by mouth. 02/17/2015: Medication taken as needed.  Received from: [facility]  . omeprazole (PRILOSEC) 20 MG capsule 1 CAPSULE DR, ORAL, TWO TIMES DAILY   . TRIAMCINOLONE ACETONIDE, NASAL STEROIDS, Place into the nose. 02/17/2015: Medication taken as needed. please put on hold pt does not need this right now-aa  Received from: [facility]  . triamterene-hydrochlorothiazide (MAXZIDE-25) 37.5-25 MG tablet Take 1 tablet by mouth daily.   Marland Kitchen amLODipine (NORVASC) 5 MG tablet Take 1 tablet (5 mg total) by mouth daily.    No facility-administered encounter medications on file as of 02/24/2017.     Allergies  Allergen Reactions  . Ceftin  [Cefuroxime Axetil]   . Doxycycline Hyclate   . Lodine  [Etodolac]   . Progesterone   . Augmentin  [Amoxicillin-Pot Clavulanate] Rash    Review of Systems  Constitutional: Negative for chills, fever and malaise/fatigue.  Respiratory: Negative for shortness of breath and wheezing.   Cardiovascular: Negative for chest pain, palpitations and leg swelling.  Skin: Negative for itching and rash.  Neurological: Negative for weakness.    Objective:   Vitals:   02/24/17 0936 02/24/17 1009  BP: (!) 156/88 (!) 172/98  Pulse: 74   Resp: 16   Temp: 97.9 F (36.6 C)       Physical Exam  Constitutional: She is oriented to person, place, and time and well-developed, well-nourished, and in no distress.  Neurological: She is alert and oriented to person, place, and time.  Skin:  Mild thickening at 10 o'clock in the left breast    Assessment and Plan :   1. Essential hypertension RTC 1 month. - amLODipine (NORVASC) 5 MG tablet; Take 1  tablet (5 mg total) by mouth daily.  Dispense: 30 tablet; Refill: 3  2.Breast Lump Feels benign but pt will f/u with mammogram. May need surgical referral.  HPI, Exam and A&P Transcribed under the direction and in the presence of Megan Mansichard Gilbert, Jr., MD. Electronically Signed: Janey GreaserElena DeSanto, RMA

## 2017-02-28 LAB — HM MAMMOGRAPHY

## 2017-03-05 ENCOUNTER — Encounter: Payer: Self-pay | Admitting: Family Medicine

## 2017-04-02 ENCOUNTER — Ambulatory Visit (INDEPENDENT_AMBULATORY_CARE_PROVIDER_SITE_OTHER): Payer: BC Managed Care – PPO | Admitting: Family Medicine

## 2017-04-02 ENCOUNTER — Encounter: Payer: Self-pay | Admitting: Family Medicine

## 2017-04-02 VITALS — BP 128/78 | HR 74 | Temp 97.6°F | Resp 16 | Wt 266.0 lb

## 2017-04-02 DIAGNOSIS — I10 Essential (primary) hypertension: Secondary | ICD-10-CM | POA: Diagnosis not present

## 2017-04-02 DIAGNOSIS — E669 Obesity, unspecified: Secondary | ICD-10-CM

## 2017-04-02 MED ORDER — TRIAMTERENE-HCTZ 37.5-25 MG PO TABS: 1.0000 | ORAL_TABLET | Freq: Every day | ORAL | 3 refills | Status: DC

## 2017-04-02 MED ORDER — AMLODIPINE BESYLATE 5 MG PO TABS: 5.0000 mg | ORAL_TABLET | Freq: Every day | ORAL | 3 refills | Status: DC

## 2017-04-02 NOTE — Progress Notes (Signed)
Subjective:  HPI  Hypertension, follow-up:  BP Readings from Last 3 Encounters:  04/02/17 128/78  02/24/17 (!) 172/98  08/22/16 (!) 174/100    She was last seen for hypertension 1 months ago.  BP at that visit was 172/98. Management since that visit includes started Amlodipine. She reports good compliance with treatment. She is having side effects. She reports that it makes her feel very relaxed almost like a sedative. She is taking amlodipine every morning.  She is exercising. She is not adherent to low salt diet.   Outside blood pressures are not being checked, unless she feels bad. Patient denies chest pain, chest pressure/discomfort, claudication, dyspnea, exertional chest pressure/discomfort, fatigue, irregular heart beat, lower extremity edema, near-syncope, orthopnea, palpitations, paroxysmal nocturnal dyspnea, syncope and tachypnea.   Cardiovascular risk factors include hypertension, obesity (BMI >= 30 kg/m2) and smoking/ tobacco exposure.   Wt Readings from Last 3 Encounters:  04/02/17 266 lb (120.7 kg)  02/24/17 267 lb (121.1 kg)  08/22/16 250 lb (113.4 kg)   ------------------------------------------------------------------------    Prior to Admission medications   Medication Sig Start Date End Date Taking? Authorizing Provider  amLODipine (NORVASC) 5 MG tablet Take 1 tablet (5 mg total) by mouth daily. 02/24/17   Maple Hudson., MD  aspirin 81 MG tablet Take by mouth. 04/07/13   [provider]  Cholecalciferol (VITAMIN D) 2000 UNITS tablet Take by mouth. 04/07/12   [provider]  loratadine (CLARITIN) 10 MG tablet Take by mouth. 04/07/13   [provider]  omeprazole (PRILOSEC) 20 MG capsule 1 CAPSULE DR, ORAL, TWO TIMES DAILY 06/19/15   Maple Hudson., MD  TRIAMCINOLONE ACETONIDE, NASAL STEROIDS, Place into the nose. 04/07/13   [provider]  triamterene-hydrochlorothiazide (MAXZIDE-25) 37.5-25 MG tablet Take  1 tablet by mouth daily. 02/20/17   Maple Hudson., MD    Patient Active Problem List   Diagnosis Date Noted  . Allergic rhinitis 02/17/2015  . Barrett's esophagus 02/17/2015  . Acid reflux 02/17/2015  . 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 BP (high blood pressure) 02/17/2015  . Adiposity 02/17/2015  . Avitaminosis D 02/17/2015    Past Medical History:  Diagnosis Date  . Hypertension     Social History   Social History  . Marital status: Married    Spouse name: N/A  . Number of children: N/A  . Years of education: N/A   Occupational History  . Not on file.   Social History Main Topics  . Smoking status: Former Smoker    Packs/day: 0.50    Years: 20.00    Types: Cigarettes    Quit date: 08/19/2008  . Smokeless tobacco: Never Used  . Alcohol use No  . Drug use: No  . Sexual activity: Yes   Other Topics Concern  . Not on file   Social History Narrative  . No narrative on file    Allergies  Allergen Reactions  . Ceftin  [Cefuroxime Axetil]   . Doxycycline Hyclate   . Lodine  [Etodolac]   . Progesterone   . Augmentin  [Amoxicillin-Pot Clavulanate] Rash    Review of Systems  Constitutional: Negative.   HENT: Negative.   Eyes: Negative.   Respiratory: Negative.   Cardiovascular: Negative.   Gastrointestinal: Negative.   Genitourinary: Negative.   Musculoskeletal: Negative.   Skin: Negative.   Neurological: Negative.   Endo/Heme/Allergies: Negative.   Psychiatric/Behavioral: Negative.     Immunization History  Administered Date(s) Administered  .  Tdap 10/07/2012    Objective:  BP 128/78 (BP Location: Left Arm, Patient Position: Sitting, Cuff Size: Large)   Pulse 74   Temp 97.6 F (36.4 C) (Oral)   Resp 16   Wt 266 lb (120.7 kg)   BMI 44.26 kg/m   Physical Exam  Constitutional: She is oriented to person, place, and time and well-developed, well-nourished, and in no distress.  HENT:  Head: Normocephalic and atraumatic.  Eyes: Pupils are  equal, round, and reactive to light. Conjunctivae and EOM are normal.  Neck: Normal range of motion. Neck supple.  Cardiovascular: Normal rate, regular rhythm, normal heart sounds and intact distal pulses.   Pulmonary/Chest: Effort normal and breath sounds normal.  Musculoskeletal: Normal range of motion. She exhibits edema (trace).  Neurological: She is alert and oriented to person, place, and time. She has normal reflexes. Gait normal. GCS score is 15.  Skin: Skin is warm and dry.  Psychiatric: Mood, memory, affect and judgment normal.    Lab Results  Component Value Date   WBC 8.8 08/22/2016   HGB 14.6 08/22/2016   HCT 43.4 08/22/2016   PLT 273 08/22/2016   GLUCOSE 137 (H) 08/22/2016   CHOL 181 04/13/2015   TRIG 144 04/13/2015   HDL 45 04/13/2015   LDLCALC 107 (H) 04/13/2015   TSH 4.370 04/13/2015   INR 1.0 11/22/2012    CMP     Component Value Date/Time   NA 137 08/22/2016 1730   NA 137 04/13/2015 1021   NA 139 11/22/2012 1345   K 3.5 08/22/2016 1730   K 3.5 11/22/2012 1345   CL 103 08/22/2016 1730   CL 105 11/22/2012 1345   CO2 24 08/22/2016 1730   CO2 26 11/22/2012 1345   GLUCOSE 137 (H) 08/22/2016 1730   GLUCOSE 128 (H) 11/22/2012 1345   BUN 17 08/22/2016 1730   BUN 14 04/13/2015 1021   BUN 17 11/22/2012 1345   CREATININE 0.82 08/22/2016 1730   CREATININE 0.98 11/22/2012 1345   CALCIUM 9.4 08/22/2016 1730   CALCIUM 9.0 11/22/2012 1345   PROT 8.0 08/22/2016 1730   PROT 7.2 04/13/2015 1021   PROT 7.6 11/22/2012 1345   ALBUMIN 4.1 08/22/2016 1730   ALBUMIN 4.3 04/13/2015 1021   ALBUMIN 3.9 11/22/2012 1345   AST 28 08/22/2016 1730   AST 112 (H) 11/22/2012 1345   ALT 28 08/22/2016 1730   ALT 93 (H) 11/22/2012 1345   ALKPHOS 87 08/22/2016 1730   ALKPHOS 105 11/22/2012 1345   BILITOT 0.6 08/22/2016 1730   BILITOT 0.5 04/13/2015 1021   BILITOT 1.1 (H) 11/22/2012 1345   GFRNONAA >60 08/22/2016 1730   GFRNONAA >60 11/22/2012 1345   GFRAA >60 08/22/2016 1730    GFRAA >60 11/22/2012 1345    Assessment and Plan :  1. Essential hypertension Improved. Discussed diet and exercise. Follow up with CPE in 6 months - amLODipine (NORVASC) 5 MG tablet; Take 1 tablet (5 mg total) by mouth daily.  Dispense: 90 tablet; Refill: 3  2. Obesity, unspecified classification, unspecified obesity type, unspecified whether serious comorbidity present Discussed diet and exercise.   HPI, Exam, and A&P Transcribed under the direction and in the presence of Kourtney Terriquez L. Wendelyn Breslow, MD  Electronically Signed: Silvio Pate, CMA I have done the exam and reviewed the above chart and it is accurate to the best of my knowledge. Dentist has been used in this note in any air is in the dictation or transcription are unintentional.  Julieanne Mansonichard Jaxxon Naeem MD [facility] 04/02/2017 9:28 AM

## 2017-04-02 NOTE — Patient Instructions (Signed)
Start taking Amlodipine at night instead of in the mornings

## 2017-09-24 ENCOUNTER — Encounter: Payer: Self-pay | Admitting: Family Medicine

## 2017-10-14 ENCOUNTER — Ambulatory Visit (INDEPENDENT_AMBULATORY_CARE_PROVIDER_SITE_OTHER): Payer: BC Managed Care – PPO | Admitting: Family Medicine

## 2017-10-14 ENCOUNTER — Other Ambulatory Visit: Payer: Self-pay

## 2017-10-14 VITALS — BP 168/90 | HR 82 | Temp 98.1°F | Resp 16 | Wt 258.0 lb

## 2017-10-14 DIAGNOSIS — Z0000 Encounter for general adult medical examination without abnormal findings: Secondary | ICD-10-CM

## 2017-10-14 DIAGNOSIS — Z1211 Encounter for screening for malignant neoplasm of colon: Secondary | ICD-10-CM

## 2017-10-14 LAB — IFOBT (OCCULT BLOOD): IMMUNOLOGICAL FECAL OCCULT BLOOD TEST: NEGATIVE

## 2017-10-14 NOTE — Progress Notes (Signed)
Patient: Linda Hoffman, Female    DOB: January 17, 1956, 62 y.o.   MRN: [id number] Visit Date: 10/14/2017  Today's Provider: Wilhemena Durie, MD   Chief Complaint  Patient presents with  . Annual Exam   Subjective:  Linda Hoffman is a 62 y.o. female who presents today for health maintenance and complete physical. She feels well. She reports exercising none. She reports she is sleeping well.  Immunization History  Administered Date(s) Administered  . Tdap 10/07/2012   08/30/08 Colonoscopy, Wohl-hyperplastic polyp 04/13/15 Pap-normal, s/p hysterectomy 03/05/17 Mammogram -neg 06/23/08 BMD-normal   Review of Systems  Constitutional: Negative.   HENT: Negative.   Eyes: Negative.   Respiratory: Negative.   Cardiovascular: Negative.   Gastrointestinal: Negative.   Endocrine: Negative.   Genitourinary: Negative.   Musculoskeletal: Negative.   Skin: Negative.   Allergic/Immunologic: Negative.   Neurological: Negative.   Hematological: Negative.   Psychiatric/Behavioral: Negative.     Social History   Socioeconomic History  . Marital status: Married    Spouse name: Not on file  . Number of children: Not on file  . Years of education: Not on file  . Highest education level: Not on file  Social Needs  . Financial resource strain: Not on file  . Food insecurity - worry: Not on file  . Food insecurity - inability: Not on file  . Transportation needs - medical: Not on file  . Transportation needs - non-medical: Not on file  Occupational History  . Not on file  Tobacco Use  . Smoking status: Former Smoker    Packs/day: 0.50    Years: 20.00    Pack years: 10.00    Types: Cigarettes    Last attempt to quit: 08/19/2008    Years since quitting: 9.1  . Smokeless tobacco: Never Used  Substance and Sexual Activity  . Alcohol use: No  . Drug use: No  . Sexual activity: Yes  Other Topics Concern  . Not on file  Social History Narrative  . Not on file    Patient Active Problem  List   Diagnosis Date Noted  . Allergic rhinitis 02/17/2015  . Barrett's esophagus 02/17/2015  . Acid reflux 02/17/2015  . 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 BP (high blood pressure) 02/17/2015  . Adiposity 02/17/2015  . Avitaminosis D 02/17/2015    Past Surgical History:  Procedure Laterality Date  . ABDOMINAL HYSTERECTOMY  2002   total-BSO  . CHOLECYSTECTOMY    . TRIGGER FINGER RELEASE    . UPPER GI ENDOSCOPY  11/22/08   normal    Her family history includes Diabetes in her brother, father, mother, and sister; Esophageal cancer in her maternal grandfather; Heart disease in her father; Hyperlipidemia in her mother; Hypertension in her brother, brother, mother, and sister; Kidney failure in her mother; Liver cancer in her paternal grandfather; Multiple myeloma in her father; Thyroid cancer in her brother; Thyroid disease in her mother and sister.     Outpatient Encounter Medications as of 10/14/2017  Medication Sig Note  . amLODipine (NORVASC) 5 MG tablet Take 1 tablet (5 mg total) by mouth daily.   Marland Kitchen aspirin 81 MG tablet Take by mouth. 02/17/2015: Received from: [facility]  . Cholecalciferol (VITAMIN D) 2000 UNITS tablet Take by mouth. 02/17/2015: Received from: [facility]  . loratadine (CLARITIN) 10 MG tablet Take by mouth. 02/17/2015: Medication taken as needed.  Received from: [facility]  . omeprazole (PRILOSEC) 20 MG capsule 1 CAPSULE DR, ORAL,  TWO TIMES DAILY (Patient taking differently: PRN)   . TRIAMCINOLONE ACETONIDE, NASAL STEROIDS, Place into the nose. 02/17/2015: Medication taken as needed. please put on hold pt does not need this right now-aa Received from: [facility]  . triamterene-hydrochlorothiazide (MAXZIDE-25) 37.5-25 MG tablet Take 1 tablet by mouth daily.    No facility-administered encounter medications on file as of 10/14/2017.     Patient Care Team: Jerrol Banana., MD as PCP - General  (Family Medicine)      Objective:   Vitals:  Vitals:   10/14/17 0944  BP: (!) 168/90  Pulse: 82  Resp: 16  Temp: 98.1 F (36.7 C)  TempSrc: Oral  Weight: 258 lb (117 kg)    Physical Exam  Constitutional: She is oriented to person, place, and time. She appears well-developed and well-nourished.  Obese WF NAD.  HENT:  Head: Normocephalic and atraumatic.  Right Ear: External ear normal.  Left Ear: External ear normal.  Nose: Nose normal.  Mouth/Throat: Oropharynx is clear and moist.  Eyes: Conjunctivae and EOM are normal. Pupils are equal, round, and reactive to light.  Neck: Normal range of motion. Neck supple.  Cardiovascular: Normal rate, regular rhythm, normal heart sounds and intact distal pulses.  Pulmonary/Chest: Effort normal and breath sounds normal.  Left breast 10 4 x 6 non tender, mobile fibrocystic area  Abdominal: Soft. Bowel sounds are normal.  Musculoskeletal: Normal range of motion.  Neurological: She is alert and oriented to person, place, and time.  Skin: Skin is warm and dry.  Psychiatric: She has a normal mood and affect. Her behavior is normal. Judgment and thought content normal.   Fall Risk  10/14/2017 04/13/2015  Falls in the past year? No No   Depression Screen PHQ 2/9 Scores 10/14/2017 04/02/2017 04/13/2015  PHQ - 2 Score 0 0 0   Functional Status Survey: Is the patient deaf or have difficulty hearing?: No Does the patient have difficulty seeing, even when wearing glasses/contacts?: No Does the patient have difficulty concentrating, remembering, or making decisions?: No Does the patient have difficulty walking or climbing stairs?: No Does the patient have difficulty dressing or bathing?: No Does the patient have difficulty doing errands alone such as visiting a doctor's office or shopping?: No      Assessment & Plan:     Routine Health Maintenance and Physical Exam  Exercise Activities and Dietary recommendations Goals    None       Immunization History  Administered Date(s) Administered  . Tdap 10/07/2012    Health Maintenance  Topic Date Due  . Hepatitis C Screening  06/29/1956  . HIV Screening  04/13/1973  . INFLUENZA VACCINE  03/19/2017  . PAP SMEAR  04/12/2018  . COLONOSCOPY  08/30/2018  . MAMMOGRAM  03/01/2019  . TETANUS/TDAP  10/07/2022     Discussed health benefits of physical activity, and encouraged her to engage in regular exercise appropriate for her age and condition.  S/p Hysterectomy for Endometriosis HTN Recheck BP 144/84.    I have done the exam and reviewed the chart and it is accurate to the best of my knowledge. Development worker, community has been used and  any errors in dictation or transcription are unintentional. Miguel Aschoff M.D. [facility]

## 2017-10-15 ENCOUNTER — Telehealth: Payer: Self-pay

## 2017-10-15 LAB — CBC WITH DIFFERENTIAL/PLATELET
Basophils Absolute: 0 10*3/uL (ref 0.0–0.2)
Basos: 1 %
EOS (ABSOLUTE): 0.3 10*3/uL (ref 0.0–0.4)
EOS: 4 %
HEMATOCRIT: 43.8 % (ref 34.0–46.6)
HEMOGLOBIN: 14.7 g/dL (ref 11.1–15.9)
Immature Grans (Abs): 0 10*3/uL (ref 0.0–0.1)
Immature Granulocytes: 0 %
LYMPHS ABS: 2.1 10*3/uL (ref 0.7–3.1)
Lymphs: 28 %
MCH: 28.4 pg (ref 26.6–33.0)
MCHC: 33.6 g/dL (ref 31.5–35.7)
MCV: 85 fL (ref 79–97)
MONOCYTES: 4 %
MONOS ABS: 0.3 10*3/uL (ref 0.1–0.9)
Neutrophils Absolute: 4.8 10*3/uL (ref 1.4–7.0)
Neutrophils: 63 %
Platelets: 275 10*3/uL (ref 150–379)
RBC: 5.18 x10E6/uL (ref 3.77–5.28)
RDW: 15.1 % (ref 12.3–15.4)
WBC: 7.5 10*3/uL (ref 3.4–10.8)

## 2017-10-15 LAB — COMPREHENSIVE METABOLIC PANEL
ALT: 34 IU/L — ABNORMAL HIGH (ref 0–32)
AST: 27 IU/L (ref 0–40)
Albumin/Globulin Ratio: 1.2 (ref 1.2–2.2)
Albumin: 4.2 g/dL (ref 3.5–5.5)
Alkaline Phosphatase: 110 IU/L (ref 39–117)
BUN/Creatinine Ratio: 15 (ref 9–23)
BUN: 15 mg/dL (ref 6–24)
Bilirubin Total: 0.8 mg/dL (ref 0.0–1.2)
CHLORIDE: 95 mmol/L — AB (ref 96–106)
CO2: 23 mmol/L (ref 20–29)
Calcium: 9.7 mg/dL (ref 8.7–10.2)
Creatinine, Ser: 0.98 mg/dL (ref 0.57–1.00)
GFR, EST AFRICAN AMERICAN: 73 mL/min/{1.73_m2} (ref >59)
GFR, EST NON AFRICAN AMERICAN: 63 mL/min/{1.73_m2} (ref >59)
Globulin, Total: 3.5 g/dL (ref 1.5–4.5)
Glucose: 234 mg/dL — ABNORMAL HIGH (ref 65–99)
Potassium: 4 mmol/L (ref 3.5–5.2)
Sodium: 138 mmol/L (ref 134–144)
TOTAL PROTEIN: 7.7 g/dL (ref 6.0–8.5)

## 2017-10-15 LAB — LIPID PANEL WITH LDL/HDL RATIO
Cholesterol, Total: 185 mg/dL (ref 100–199)
HDL: 38 mg/dL — ABNORMAL LOW (ref >39)
LDL Calculated: 107 mg/dL — ABNORMAL HIGH (ref 0–99)
LDL/HDL RATIO: 2.8 ratio (ref 0.0–3.2)
TRIGLYCERIDES: 198 mg/dL — AB (ref 0–149)
VLDL CHOLESTEROL CAL: 40 mg/dL (ref 5–40)

## 2017-10-15 LAB — TSH: TSH: 4.01 u[IU]/mL (ref 0.450–4.500)

## 2017-10-15 NOTE — Telephone Encounter (Signed)
-----   Message from Maple Hudsonichard L Gilbert Jr., MD sent at 10/15/2017  9:35 AM EST ----- Now diabetic--appt in next few weeks for A1C and to discuss issue.

## 2017-10-15 NOTE — Telephone Encounter (Signed)
Pt advised. OV made. States she would like to discuss options other than starting medication.

## 2017-10-20 ENCOUNTER — Other Ambulatory Visit: Payer: Self-pay

## 2017-10-20 ENCOUNTER — Ambulatory Visit (INDEPENDENT_AMBULATORY_CARE_PROVIDER_SITE_OTHER): Payer: BC Managed Care – PPO | Admitting: Family Medicine

## 2017-10-20 VITALS — BP 168/90 | HR 80 | Temp 98.0°F | Resp 16 | Wt 258.0 lb

## 2017-10-20 DIAGNOSIS — E119 Type 2 diabetes mellitus without complications: Secondary | ICD-10-CM | POA: Diagnosis not present

## 2017-10-20 DIAGNOSIS — I10 Essential (primary) hypertension: Secondary | ICD-10-CM | POA: Diagnosis not present

## 2017-10-20 DIAGNOSIS — Z6841 Body Mass Index (BMI) 40.0 and over, adult: Secondary | ICD-10-CM

## 2017-10-20 LAB — POCT GLYCOSYLATED HEMOGLOBIN (HGB A1C): Hemoglobin A1C: 10.8

## 2017-10-20 MED ORDER — METFORMIN HCL 500 MG PO TABS: 500.0000 mg | ORAL_TABLET | Freq: Two times a day (BID) | ORAL | 3 refills | Status: DC

## 2017-10-20 MED ORDER — ONETOUCH ULTRALINK W/DEVICE KIT: 1.0000 | PACK | Freq: Every day | 12 refills | Status: DC

## 2017-10-20 NOTE — Progress Notes (Signed)
Linda Hoffman  MRN: [id number] DOB: 11-Aug-1956  Subjective:  HPI   The patient is a 62 year old female who presents for evaluation of newly diagnosed diabetes.  She was last seen on 10/14/17 and her fasting glucose level was 234 which was up from last year at 137.    Patient Active Problem List   Diagnosis Date Noted  . Allergic rhinitis 02/17/2015  . Barrett's esophagus 02/17/2015  . Acid reflux 02/17/2015  . 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 BP (high blood pressure) 02/17/2015  . Adiposity 02/17/2015  . Avitaminosis D 02/17/2015    Past Medical History:  Diagnosis Date  . Hypertension     Social History   Socioeconomic History  . Marital status: Married    Spouse name: Not on file  . Number of children: Not on file  . Years of education: Not on file  . Highest education level: Not on file  Social Needs  . Financial resource strain: Not on file  . Food insecurity - worry: Not on file  . Food insecurity - inability: Not on file  . Transportation needs - medical: Not on file  . Transportation needs - non-medical: Not on file  Occupational History  . Not on file  Tobacco Use  . Smoking status: Former Smoker    Packs/day: 0.50    Years: 20.00    Pack years: 10.00    Types: Cigarettes    Last attempt to quit: 08/19/2008    Years since quitting: 9.1  . Smokeless tobacco: Never Used  Substance and Sexual Activity  . Alcohol use: No  . Drug use: No  . Sexual activity: Yes  Other Topics Concern  . Not on file  Social History Narrative  . Not on file    Outpatient Encounter Medications as of 10/20/2017  Medication Sig Note  . amLODipine (NORVASC) 5 MG tablet Take 1 tablet (5 mg total) by mouth daily.   Marland Kitchen aspirin 81 MG tablet Take by mouth. 02/17/2015: Received from: [facility]  . Cholecalciferol (VITAMIN D) 2000 UNITS tablet Take by mouth. 02/17/2015: Received from: [facility]  . loratadine (CLARITIN) 10 MG tablet Take by mouth.  02/17/2015: Medication taken as needed.  Received from: [facility]  . omeprazole (PRILOSEC) 20 MG capsule 1 CAPSULE DR, ORAL, TWO TIMES DAILY (Patient taking differently: PRN)   . TRIAMCINOLONE ACETONIDE, NASAL STEROIDS, Place into the nose. 02/17/2015: Medication taken as needed. please put on hold pt does not need this right now-aa Received from: [facility]  . triamterene-hydrochlorothiazide (MAXZIDE-25) 37.5-25 MG tablet Take 1 tablet by mouth daily.    No facility-administered encounter medications on file as of 10/20/2017.     Allergies  Allergen Reactions  . Ceftin  [Cefuroxime Axetil]   . Doxycycline Hyclate   . Lodine  [Etodolac]   . Progesterone   . Augmentin  [Amoxicillin-Pot Clavulanate] Rash    Review of Systems  Constitutional: Negative for fever and malaise/fatigue.  Respiratory: Negative for cough, shortness of breath and wheezing.   Cardiovascular: Negative for chest pain, palpitations, orthopnea and claudication (ankle).  Genitourinary: Positive for frequency.  Neurological: Negative.  Negative for weakness.  Endo/Heme/Allergies: Negative.  Negative for polydipsia.  Psychiatric/Behavioral: Negative.     Objective:  BP (!) 168/90 (BP Location: Right Arm, Patient Position: Sitting, Cuff Size: Normal)   Pulse 80   Temp 98 F (36.7 C) (Oral)   Resp 16   Wt 258 lb (117 kg)  BMI 42.93 kg/m   Physical Exam  Constitutional: She is oriented to person, place, and time and well-developed, well-nourished, and in no distress.  HENT:  Head: Normocephalic and atraumatic.  Eyes: Pupils are equal, round, and reactive to light. No scleral icterus.  Neck: Normal range of motion. Neck supple. No thyromegaly present.  Cardiovascular: Normal rate, regular rhythm and normal heart sounds.  Pulmonary/Chest: Effort normal and breath sounds normal.  Neurological: She is alert and oriented to person, place, and time.  Skin: Skin is warm and dry.    Psychiatric: Memory normal.    Assessment and Plan :   1. Diabetes mellitus without complication ([place]) Discussed at length that pt has some control of this IF she cahnges diet and exercise habits. RTC 1-3 months. - POCT glycosylated hemoglobin (Hb A1C) - metFORMIN (GLUCOPHAGE) 500 MG tablet; Take 1 tablet (500 mg total) by mouth 2 (two) times daily with a meal.  Dispense: 180 tablet; Refill: 3 - Blood Glucose Monitoring Suppl (ONETOUCH ULTRALINK) w/Device KIT; 1 strip by Does not apply route daily.  Dispense: 100 each; Refill: 12 - Referral to Nutrition and Diabetes Services 2.Morbid Obesity   I have done the exam and reviewed the chart and it is accurate to the best of my knowledge. Development worker, community has been used and  any errors in dictation or transcription are unintentional. Miguel Aschoff M.D. [facility]

## 2017-10-21 ENCOUNTER — Telehealth: Payer: Self-pay | Admitting: Family Medicine

## 2017-10-21 ENCOUNTER — Other Ambulatory Visit: Payer: Self-pay

## 2017-10-21 NOTE — Telephone Encounter (Signed)
Done

## 2017-10-21 NOTE — Telephone Encounter (Signed)
Rx for Blood Glucose Monitoring Suppl (ONETOUCH ULTRALINK) w/Device KIT was sent to [facility]. Pt stated that [facility] advised pt that they require a separate Rx for the meter that they don't do the meter and the test stripes as a kit. Pt is requesting Rx for the meter to be sent to [facility]. Pt stated she is currently at the pharmacy. Please advise. Thanks TNP

## 2017-10-27 ENCOUNTER — Encounter: Payer: BC Managed Care – PPO | Attending: Family Medicine | Admitting: Dietician

## 2017-10-27 ENCOUNTER — Encounter: Payer: Self-pay | Admitting: Dietician

## 2017-10-27 VITALS — Ht 65.5 in | Wt 251.3 lb

## 2017-10-27 DIAGNOSIS — E119 Type 2 diabetes mellitus without complications: Secondary | ICD-10-CM

## 2017-10-27 NOTE — Progress Notes (Signed)
Medical Nutrition Therapy: Visit start time: 1600  end time: 1700 Assessment:  Diagnosis: Type 2 Diabetes Past medical history: hypertension, Barrettis esophagus Psychosocial issues/ stress concerns: none identified; patient rates her stress as low Preferred learning method:  . Auditory . Visual . Hands-on Current weight: 251.3 lbs Height: 65.5  in Medications, supplements: see list Progress and evaluation:  Patient in for initial medical nutrition therapy appointment. Newly diagnosed with Type 2 diabetes; HgbA1c was 10.8.  She is checking blood sugars at home with readings of 160's to 170's. She has made significant positive changes in her diet, including decreasing portions, making lower fat choices, eliminating candy (typically kept at her desk at work) and not eating after 8:00 on most days. She has lost 6-7 lbs in the last week. Her present diet is low in fruits/vegetables and whole grains.   Physical activity: no structured exercise. She is getting up more from her desk at work and walking.  Dietary Intake:  Usual eating pattern includes 3 meals and 1-2 snacks per day. Dining out frequency: 5 meals per week.  Breakfast: 7-8:00am- lite Greek yogurt, 1-2 eggs, diet coke Snack: almonds or walnuts Lunch: 12:30-ham/cheese sandwich, lite chips, water or diet coke Snack: sunflower seeds Supper: 7:00pm- spaghetti/salad or lasagne/salad or sub sandwich or baked chicken, sweet potatoes, green beans for examples, water Usually no snack after dinner  Beverages: water, diet coke  Nutrition Care Education: Diabetes:  goals for BGs, appropriate meal and snack schedule, Carb counting, appropriate carb intake and balance,  Commended patient on the positive diet changes she has made thus far. Used handouts and food models to how to better balance protein and carbohydrate. Instructed on how exercise will help with weight loss as well as glucose control and discussed options. Gave and reviewed  sample menus and reviewed reading food labels. Hypertension: guidelines for sodium intake; reading labels for sodium Nutritional Diagnosis:  [place]-3.3 Overweight/obesity As related to history of large portions, excessive sweets.  As evidenced by diet history..  Intervention:  Balance meals with protein (2-4 oz), 2-3 servings of carbohydrate and non-starchy vegetables. Read labels for carbohydrate. Can subtract fiber grams. Also, read labels for sodium with goal of no more than 500-600 mg per meal. Increase walking at work. Get up from desk and move more.  Education Materials given:  . Plate Planner . Food lists/ Planning A Balanced Meal . Sample meal pattern/ menus . Goals/ instructions  Learner/ who was taught:  . Patient  Level of understanding: . Partial understanding; needs review/ practice Demonstrated degree of understanding via:   Teach back Learning barriers: . None  Willingness to learn/ readiness for change: . Eager, change in progress Monitoring and Evaluation:  Dietary intake, exercise, follow up: 11/24/17 at 2:15pm

## 2017-10-27 NOTE — Patient Instructions (Signed)
Balance meals with protein (2-4 oz), 2-3 servings of carbohydrate and non-starchy vegetables. Read labels for carbohydrate. Can subtract fiber grams. Also, read labels for sodium with goal of no more than 500-600 mg per meal. Increase walking at work. Get up from desk and move more.

## 2017-11-24 ENCOUNTER — Encounter: Payer: Self-pay | Admitting: Family Medicine

## 2017-11-24 ENCOUNTER — Encounter: Payer: BC Managed Care – PPO | Attending: Family Medicine | Admitting: Dietician

## 2017-11-24 ENCOUNTER — Ambulatory Visit (INDEPENDENT_AMBULATORY_CARE_PROVIDER_SITE_OTHER): Payer: BC Managed Care – PPO | Admitting: Family Medicine

## 2017-11-24 ENCOUNTER — Encounter: Payer: Self-pay | Admitting: Dietician

## 2017-11-24 VITALS — Ht 65.5 in | Wt 244.0 lb

## 2017-11-24 VITALS — BP 122/70 | HR 76 | Temp 98.4°F | Resp 14 | Wt 243.0 lb

## 2017-11-24 DIAGNOSIS — Z6841 Body Mass Index (BMI) 40.0 and over, adult: Secondary | ICD-10-CM | POA: Diagnosis not present

## 2017-11-24 DIAGNOSIS — E119 Type 2 diabetes mellitus without complications: Secondary | ICD-10-CM | POA: Diagnosis not present

## 2017-11-24 DIAGNOSIS — I10 Essential (primary) hypertension: Secondary | ICD-10-CM

## 2017-11-24 DIAGNOSIS — E1165 Type 2 diabetes mellitus with hyperglycemia: Secondary | ICD-10-CM

## 2017-11-24 NOTE — Patient Instructions (Addendum)
Continue with previous goals: Balance meals with protein (2-4 oz), 2-3 servings of carbohydrate and non-starchy vegetables. Read labels for carbohydrate. Can subtract fiber grams. Also, read labels for sodium with goal of no more than 500-600 mg per meal.  At today's visit: Try very low sodium tuna. Try Campbell's low sodium chicken noodle soup. Continue to increase steps with goal of 10,000/day.

## 2017-11-24 NOTE — Progress Notes (Signed)
Patient: Linda Hoffman Female    DOB: 10/05/1955   62 y.o.   MRN: [id number] Visit Date: 11/24/2017  Today's Provider: Wilhemena Durie, MD   Chief Complaint  Patient presents with  . Diabetes   Subjective:    HPI Pt is here for follow up of new onset DM. She reports that she is doing well. Her blood sugars have been running 130-150's fasting, it is usually lower before supper. She is exercising/walking daily and tries to get 10,000 steps a day. She has lost 15 pounds since she was here last. She was 258 lb when she was here on 10/20/17, today she is 243!! [facility].     Allergies  Allergen Reactions  . Ceftin  [Cefuroxime Axetil]   . Doxycycline Hyclate   . Lodine  [Etodolac]   . Progesterone   . Augmentin  [Amoxicillin-Pot Clavulanate] Rash     Current Outpatient Medications:  .  amLODipine (NORVASC) 5 MG tablet, Take 1 tablet (5 mg total) by mouth daily., Disp: 90 tablet, Rfl: 3 .  Blood Glucose Monitoring Suppl (ONETOUCH ULTRALINK) w/Device KIT, 1 strip by Does not apply route daily., Disp: 100 each, Rfl: 12 .  loratadine (CLARITIN) 10 MG tablet, Take by mouth., Disp: , Rfl:  .  metFORMIN (GLUCOPHAGE) 500 MG tablet, Take 1 tablet (500 mg total) by mouth 2 (two) times daily with a meal. (Patient taking differently: Take 500 mg by mouth 1 day or 1 dose. ), Disp: 180 tablet, Rfl: 3 .  Multiple Vitamin (MULTIVITAMIN) tablet, Take 1 tablet by mouth daily., Disp: , Rfl:  .  omeprazole (PRILOSEC) 20 MG capsule, 1 CAPSULE DR, ORAL, TWO TIMES DAILY (Patient taking differently: PRN), Disp: 60 capsule, Rfl: 5 .  ONE TOUCH ULTRA TEST test strip, 1 STRIP BY DOES NOT APPLY ROUTE DAILY., Disp: , Rfl: 12 .  ONETOUCH DELICA LANCETS FINE MISC, , Disp: , Rfl:  .  triamterene-hydrochlorothiazide (MAXZIDE-25) 37.5-25 MG tablet, Take 1 tablet by mouth daily., Disp: 90 tablet, Rfl: 3 .  aspirin 81 MG tablet, Take by mouth., Disp: , Rfl:  .  Cholecalciferol (VITAMIN  D) 2000 UNITS tablet, Take by mouth., Disp: , Rfl:  .  TRIAMCINOLONE ACETONIDE, NASAL STEROIDS,, Place into the nose., Disp: , Rfl:   Review of Systems  Constitutional: Negative.   HENT: Negative.   Eyes: Negative.   Respiratory: Negative.   Cardiovascular: Negative.   Gastrointestinal: Negative.   Endocrine: Negative.   Genitourinary: Negative.   Musculoskeletal: Negative.   Skin: Negative.   Allergic/Immunologic: Negative.   Neurological: Negative.   Hematological: Negative.   Psychiatric/Behavioral: Negative.     Social History   Tobacco Use  . Smoking status: Former Smoker    Packs/day: 0.50    Years: 20.00    Pack years: 10.00    Types: Cigarettes    Last attempt to quit: 08/19/2008    Years since quitting: 9.2  . Smokeless tobacco: Never Used  Substance Use Topics  . Alcohol use: No   Objective:   BP 122/70 (BP Location: Left Arm, Patient Position: Sitting, Cuff Size: Large)   Pulse 76   Temp 98.4 F (36.9 C) (Oral)   Resp 14   Wt 243 lb (110.2 kg)   BMI 39.82 kg/m  Vitals:   11/24/17 1534  BP: 122/70  Pulse: 76  Resp: 14  Temp: 98.4 F (36.9 C)  TempSrc: Oral  Weight: 243 lb (110.2  kg)     Physical Exam  Constitutional: She is oriented to person, place, and time. She appears well-developed and well-nourished.  HENT:  Head: Normocephalic and atraumatic.  Eyes: No scleral icterus.  Neck: No thyromegaly present.  Cardiovascular: Normal rate, regular rhythm and normal heart sounds.  Pulmonary/Chest: Effort normal and breath sounds normal.  Abdominal: Soft.  Neurological: She is alert and oriented to person, place, and time.  Skin: Skin is warm and dry.  Psychiatric: She has a normal mood and affect. Her behavior is normal. Judgment and thought content normal.        Assessment & Plan:     TIIDM Obesity Pt has lost 15 lbs in past 5 weeks with diet and exercise. RTC 2-3 months.  I have done the exam and reviewed the chart and it is accurate  to the best of my knowledge. Development worker, community has been used and  any errors in dictation or transcription are unintentional. Miguel Aschoff M.D. [place], MD  [facility]

## 2017-11-24 NOTE — Progress Notes (Signed)
Medical Nutrition Therapy Follow-up visit:  Time with patient: 1415-15:05 Visit #:2 ASSESSMENT:  Diagnosis:Type 2 Diabetes  Current weight:244 lbs    Height: 65.5 in Medications: See list Medical History: hypertension, Barrett's esophagus Progress and evaluation: Patient in for medical nutrition follow-up. She has continued to make positive changes in her diet. She has increased her intake of fruits and vegetables, averaging 4-5 servings daily. She meets protein needs through meats, greek yogurt, string cheese and eggs. Her carbohydrate intake is not excessive and may be lower than recommended on some days. She is consistent with a meal plan of 3 meals and 1-2 small snacks. Her weight today is 7.3 lbs less than weight 1 month ago. She is also being very conscious of her sodium intake using Ms. DASH seasonings and reading labels for sodium. She has a water intake of 56-64 oz. She drinks diet coke several days per week verses several per day. She reports her fasting blood sugars continue to be more elevated than at other times of day with 140's as typical. She gives a 14 day average of readings from her meter of 137.  Physical activity:She is waking during breaks at work averaging 5000-8000 steps daily with goal of 10,000.   NUTRITION CARE EDUCATION: Diabetes/weight control: Commended on continuing with positive diet changes and on increasing her exercise as well as monitoring her steps. Discussed role of exercise with glucose control in addition to weight loss. Reviewed meal plan instructed on at previous visit and basic food group needs. Reviewed glucose goal of 80-130 for fasting sugars as well as ideal goal of less than 160 for 2 hour post meal readings. Discussed more ideas for lower sodium food products.  INTERVENTION:  Continue with previous goals: Balance meals with protein (2-4 oz), 2-3 servings of carbohydrate and non-starchy vegetables. Read labels for carbohydrate. Can  subtract fiber grams. Also, read labels for sodium with goal of no more than 500-600 mg per meal.  At today's visit: Try very low sodium tuna. Try [employer]'s low sodium chicken noodle soup. Continue to increase steps with goal of 10,000/day. EDUCATION MATERIALS GIVEN:  . Goals/ instructions LEARNER/ who was taught:  . Patient  LEVEL OF UNDERSTANDING: . Verbalizes/ demonstrates competency Demonstrated degree of understanding via teach back.  LEARNING BARRIERS: . None  WILLINGNESS TO LEARN/READINESS FOR CHANGE: . Eager, change in progress  MONITORING AND EVALUATION:   Diet, exercise, weight Follow-up: 12/29/17 at 3:00pm

## 2017-12-04 LAB — HM DIABETES EYE EXAM

## 2017-12-29 ENCOUNTER — Encounter: Payer: Self-pay | Admitting: Dietician

## 2017-12-29 ENCOUNTER — Encounter: Payer: BC Managed Care – PPO | Attending: Family Medicine | Admitting: Dietician

## 2017-12-29 VITALS — Ht 65.5 in | Wt 234.3 lb

## 2017-12-29 DIAGNOSIS — E119 Type 2 diabetes mellitus without complications: Secondary | ICD-10-CM

## 2017-12-29 NOTE — Progress Notes (Signed)
Medical Nutrition Therapy Follow-up visit:  Time with patient: 1515-1600 Visit #: 3 ASSESSMENT:  Diagnosis:Type 2 Diabetes  Current weight:234.3 lbs   Height:65.5in Medications: See list Medical History: hypertension, Barrett's esophagus Progress and evaluation: Patient in for medical nutrition follow up. She continues to include a protein food at most meals and includes 4-5 servings of fruits/vegetables daily. She continues to limit her carbohydrate intake. She uses myfitnesspal app to record intake on some days.  She continues to have a consistent meal pattern of 3 meals and 1-2 small snacks. She reads labels for carbohydrate content and sodium content. She drinks at least 64 oz of water daily. She reports that her FBG's are typically in 120's to 130 and readings are in range of 104 -120 at other times. Her weight today is 9.7 lbs less than her weight 5 weeks ago. Physical activity: She now reaches her goal of walking 10,000 steps on most days.   NUTRITION CARE EDUCATION: Diabetes:  Reviewed goals for BGs, appropriate meal and snack schedule, Carb counting, appropriate carb intake and balance. Commended on her continued efforts to make positive diet changes and encouraged to avoid over restriction. Discussed occasionally working higher fat or higher sugar foods into her meal plan. Exercise: Discussed adding some resistance exercises in addition to walking for exercise. Gave and reviewed hand-out with  illustrations of resistance exercises.  INTERVENTION:  Try some of the resistance and strength exercises (on hand-out) at least 3 days per week in addition to walking with goal of 10,000 steps daily. Try to include at least 65 gms protein daily. Include at least 30 gms carbohydrate at meals with range of 30-45 gms at meals and 15 gms at snacks.  EDUCATION MATERIALS GIVEN:  . Exercise hand-out . Goals/ instructions LEARNER/ who was taught:  . Patient  LEVEL OF  UNDERSTANDING: . Verbalizes/ demonstrates competency Demonstrated degree of understanding via teach back.  LEARNING BARRIERS: . None  WILLINGNESS TO LEARN/READINESS FOR CHANGE: . Eager, change in progress  MONITORING AND EVALUATION:   Diet, weight, exercise Follow-up: 01/26/18 at 1:15

## 2017-12-29 NOTE — Patient Instructions (Addendum)
Try some of the resistance and strength exercises (on hand-out) at least 3 days per week in addition to walking with goal of 10,000 steps daily. Try to include at least 65 gms protein daily. Include at least 30 gms carbohydrate at meals with range of 30-45 gms at meals and 15 gms at snacks.

## 2018-01-26 ENCOUNTER — Ambulatory Visit: Payer: BC Managed Care – PPO | Admitting: Dietician

## 2018-02-10 ENCOUNTER — Ambulatory Visit: Payer: BC Managed Care – PPO | Admitting: Family Medicine

## 2018-02-10 VITALS — BP 142/82 | HR 70 | Temp 98.1°F | Resp 16 | Wt 225.0 lb

## 2018-02-10 DIAGNOSIS — E119 Type 2 diabetes mellitus without complications: Secondary | ICD-10-CM | POA: Diagnosis not present

## 2018-02-10 DIAGNOSIS — Z6841 Body Mass Index (BMI) 40.0 and over, adult: Secondary | ICD-10-CM | POA: Diagnosis not present

## 2018-02-10 DIAGNOSIS — I10 Essential (primary) hypertension: Secondary | ICD-10-CM

## 2018-02-10 LAB — POCT GLYCOSYLATED HEMOGLOBIN (HGB A1C): HEMOGLOBIN A1C: 6 % — AB (ref 4.0–5.6)

## 2018-02-10 NOTE — Progress Notes (Signed)
Linda Hoffman  MRN: [id number] DOB: Oct 30, 1955  Subjective:  HPI   The patient is a 62 year old female who presents otday for follow up of her diabetes.  She was last seen on 11/24/17.  Her last A1C was 10.8 and it was done on 10/20/17.  She had on her last visit lost 15 pounds and she has since then been to the Environmental [facility] practitioner.  Patient has been checking her glucose twice daily and has gotten an average of 118.  She has also been checking her blood pressure and it has been running good, mostly 110-130 over 70's.  Patient Active Problem List   Diagnosis Date Noted  . Allergic rhinitis 02/17/2015  . Barrett's esophagus 02/17/2015  . Acid reflux 02/17/2015  . 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 BP (high blood pressure) 02/17/2015  . Adiposity 02/17/2015  . Avitaminosis D 02/17/2015    Past Medical History:  Diagnosis Date  . Hypertension     Social History   Socioeconomic History  . Marital status: Married    Spouse name: Not on file  . Number of children: Not on file  . Years of education: Not on file  . Highest education level: Not on file  Occupational History  . Not on file  Social Needs  . Financial resource strain: Not on file  . Food insecurity:    Worry: Not on file    Inability: Not on file  . Transportation needs:    Medical: Not on file    Non-medical: Not on file  Tobacco Use  . Smoking status: Former Smoker    Packs/day: 0.50    Years: 20.00    Pack years: 10.00    Types: Cigarettes    Last attempt to quit: 08/19/2008    Years since quitting: 9.4  . Smokeless tobacco: Never Used  Substance and Sexual Activity  . Alcohol use: No  . Drug use: No  . Sexual activity: Yes  Lifestyle  . Physical activity:    Days per week: Not on file    Minutes per session: Not on file  . Stress: Not on file  Relationships  . Social connections:    Talks on phone: Not on file    Gets together: Not on file    Attends religious service: Not on file    Active  member of club or organization: Not on file    Attends meetings of clubs or organizations: Not on file    Relationship status: Not on file  . Intimate partner violence:    Fear of current or ex partner: Not on file    Emotionally abused: Not on file    Physically abused: Not on file    Forced sexual activity: Not on file  Other Topics Concern  . Not on file  Social History Narrative  . Not on file    Outpatient Encounter Medications as of 02/10/2018  Medication Sig Note  . amLODipine (NORVASC) 5 MG tablet Take 1 tablet (5 mg total) by mouth daily.   Marland Kitchen aspirin 81 MG tablet Take by mouth. 02/17/2015: Received from: [facility]  . Blood Glucose Monitoring Suppl (ONETOUCH ULTRALINK) w/Device KIT 1 strip by Does not apply route daily.   Marland Kitchen loratadine (CLARITIN) 10 MG tablet Take by mouth. 02/17/2015: Medication taken as needed.  Received from: [facility]  . metFORMIN (GLUCOPHAGE) 500 MG tablet Take 1 tablet (500 mg total) by mouth 2 (two) times daily with a meal. (Patient  taking differently: Take 500 mg by mouth 1 day or 1 dose. )   . Multiple Vitamin (MULTIVITAMIN) tablet Take 1 tablet by mouth daily.   Marland Kitchen omeprazole (PRILOSEC) 20 MG capsule 1 CAPSULE DR, ORAL, TWO TIMES DAILY (Patient taking differently: PRN)   . ONE TOUCH ULTRA TEST test strip 1 STRIP BY DOES NOT APPLY ROUTE DAILY.   Marland Kitchen ONETOUCH DELICA LANCETS FINE MISC    . TRIAMCINOLONE ACETONIDE, NASAL STEROIDS, Place into the nose. 02/17/2015: Medication taken as needed. please put on hold pt does not need this right now-aa Received from: [facility]  . triamterene-hydrochlorothiazide (MAXZIDE-25) 37.5-25 MG tablet Take 1 tablet by mouth daily.   . [DISCONTINUED] Cholecalciferol (VITAMIN D) 2000 UNITS tablet Take by mouth. 02/17/2015: Received from: [facility]   No facility-administered encounter medications on file as of 02/10/2018.     Allergies  Allergen Reactions  .  Ceftin  [Cefuroxime Axetil]   . Doxycycline Hyclate   . Lodine  [Etodolac]   . Progesterone   . Augmentin  [Amoxicillin-Pot Clavulanate] Rash    Review of Systems  Constitutional: Negative for fever and malaise/fatigue.  Eyes: Negative.   Respiratory: Negative for cough, shortness of breath and wheezing.   Cardiovascular: Negative for chest pain, palpitations and leg swelling.  Genitourinary: Negative for frequency.  Skin: Negative.   Neurological: Negative for dizziness and headaches.  Endo/Heme/Allergies: Negative for polydipsia.  Psychiatric/Behavioral: Negative.     Objective:  BP (!) 142/82 (BP Location: Right Arm, Patient Position: Sitting, Cuff Size: Normal)   Pulse 70   Temp 98.1 F (36.7 C) (Oral)   Resp 16   Wt 225 lb (102.1 kg)   BMI 36.87 kg/m   Physical Exam  Constitutional: She is well-developed, well-nourished, and in no distress.  HENT:  Head: Normocephalic and atraumatic.  Right Ear: External ear normal.  Left Ear: External ear normal.  Nose: Nose normal.  Eyes: Conjunctivae are normal. No scleral icterus.  Neck: No thyromegaly present.  Cardiovascular: Normal rate, regular rhythm and normal heart sounds.  Pulmonary/Chest: Effort normal and breath sounds normal.  Abdominal: Soft.  Musculoskeletal: She exhibits no edema.  Neurological: She is alert. Gait normal.  Skin: Skin is warm and dry.  Psychiatric: Mood, memory, affect and judgment normal.    Assessment and Plan :   1. Diabetes mellitus without complication ([place]) Much improved.RTC 3-4 months. - POCT glycosylated hemoglobin (Hb A1C)--6.0. 2.Obesity Pt working on lifestyle. 3.HTN  I have done the exam and reviewed the chart and it is accurate to the best of my knowledge. Development worker, community has been used and  any errors in dictation or transcription are unintentional. Miguel Aschoff M.D. [facility]

## 2018-02-16 ENCOUNTER — Encounter: Payer: Self-pay | Admitting: Dietician

## 2018-02-16 ENCOUNTER — Encounter: Payer: BC Managed Care – PPO | Attending: Family Medicine | Admitting: Dietician

## 2018-02-16 VITALS — Ht 65.5 in | Wt 225.2 lb

## 2018-02-16 DIAGNOSIS — E119 Type 2 diabetes mellitus without complications: Secondary | ICD-10-CM

## 2018-02-16 NOTE — Patient Instructions (Signed)
Focus on fiber content with goal of 20-25 gms daily. Refer to list given. Try a high fiber cereal possibly mixed in with your yogurt at breakfast. Continue with a good fluid intake-at least 64 oz water daily in addition to a diet coke or diet pepsi. Continue with previous goals set.

## 2018-02-16 NOTE — Progress Notes (Signed)
Medical Nutrition Therapy Follow-up visit:  Time with patient: 8:30am-9:15  Visit #:4 ASSESSMENT:  Diagnosis: Type 2 Diabetes  Current weight:225.2lbs   Height:65.5 inMedications: See list Medical History: hypertension, Barrett's esophagus Progress and evaluation: Patient in for medical nutrition follow-up. She reports her most recent HgA1c was a 6 and it had previously been 10. Her weight today is 9.1 lbs less than 7 weeks ago with a total loss of 17 lbs since her initial visit 3/'19. She continues with a meal pattern of 3 meals and 1-2 small snacks. She continues to read labels for carbohydrate a sodium and based on information given, stays within recommended amounts. She states that she is experiencing constipation with 2-3 bowel movements weekly verses daily. She also expressed concern that some of her fasting blood sugars are greater than 130 despite her focus on positive eating habits. Physical activity:She reports she has been walking less due to excessive heat but continues to average 9000 steps daily.  NUTRITION CARE EDUCATION: Diabetes:   Commended on her continued healthy eating habits. Encouraged to include at least 2 servings of carbohydrate at her dinner meal to avoid a decrease in glucose during the night which could cause more glucose release by the liver. Fiber:  Gave and reviewed a list of foods and fiber content, instructing on dietary recommendation for fiber as well as practical ways to increase in the diet. Stressed need for continued good fluid intake when increasing fiber in the diet.  INTERVENTION:  Focus on fiber content with goal of 20-25 gms daily. Refer to list given. Try a high fiber cereal possibly mixed in with your yogurt at breakfast. Continue with a good fluid intake-at least 64 oz water daily in addition to a diet coke or diet pepsi. Continue with previous goals set.   EDUCATION MATERIALS GIVEN:  . List of foods and fiber content . Goals/  instructions LEARNER/ who was taught:  . Patient   LEVEL OF UNDERSTANDING: . Verbalizes/ demonstrates competency Demonstrated degree of understanding via teach back.  LEARNING BARRIERS: . None  WILLINGNESS TO LEARN/READINESS FOR CHANGE: . Eager, change in progress  MONITORING AND EVALUATION:   Diet, exercise, weight Follow-up: 06/09/18 at 8:00am

## 2018-02-18 ENCOUNTER — Other Ambulatory Visit: Payer: Self-pay | Admitting: Family Medicine

## 2018-02-18 DIAGNOSIS — I10 Essential (primary) hypertension: Secondary | ICD-10-CM

## 2018-05-25 ENCOUNTER — Encounter: Payer: Self-pay | Admitting: Family Medicine

## 2018-05-25 ENCOUNTER — Ambulatory Visit: Payer: BC Managed Care – PPO | Admitting: Family Medicine

## 2018-05-25 VITALS — BP 120/68 | HR 68 | Temp 98.4°F | Resp 16 | Ht 65.5 in | Wt 226.0 lb

## 2018-05-25 DIAGNOSIS — E669 Obesity, unspecified: Secondary | ICD-10-CM

## 2018-05-25 DIAGNOSIS — Z23 Encounter for immunization: Secondary | ICD-10-CM | POA: Diagnosis not present

## 2018-05-25 DIAGNOSIS — E119 Type 2 diabetes mellitus without complications: Secondary | ICD-10-CM | POA: Diagnosis not present

## 2018-05-25 DIAGNOSIS — I10 Essential (primary) hypertension: Secondary | ICD-10-CM | POA: Diagnosis not present

## 2018-05-25 LAB — POCT GLYCOSYLATED HEMOGLOBIN (HGB A1C): Hemoglobin A1C: 6 % — AB (ref 4.0–5.6)

## 2018-05-25 NOTE — Progress Notes (Signed)
     Patient: Linda Hoffman Female    DOB: 07/23/1956   62 y.o.   MRN: [id number] Visit Date: 05/25/2018  Today's Provider: Richard Gilbert Jr, MD   Chief Complaint  Patient presents with  . Diabetes   Subjective:    HPI  Diabetes Mellitus Type II, Follow-up:   Lab Results  Component Value Date   HGBA1C 6.0 (A) 05/25/2018   HGBA1C 6.0 (A) 02/10/2018   HGBA1C 10.8 10/20/2017    Last seen for diabetes 4 months ago.  Management since then includes no changes. She reports good compliance with treatment. She is not having side effects.  Current symptoms include none and have been stable. Home blood sugar records: fasting range: 90-110s  Episodes of hypoglycemia? no   Current Insulin Regimen: none Most Recent Eye Exam: annually. [facility].  Weight trend: stable Prior visit with dietician: no, but patient now sees health coach once monthly.  Current diet: well balanced Current exercise: walking  Pertinent Labs:    Component Value Date/Time   CHOL 185 10/14/2017 1037   TRIG 198 (H) 10/14/2017 1037   HDL 38 (L) 10/14/2017 1037   LDLCALC 107 (H) 10/14/2017 1037   CREATININE 0.98 10/14/2017 1037   CREATININE 0.98 11/22/2012 1345    Wt Readings from Last 3 Encounters:  05/25/18 226 lb (102.5 kg)  02/16/18 225 lb 3.2 oz (102.2 kg)  02/10/18 225 lb (102.1 kg)   Weight Loss is plateaued but she still is doing well with diet and exercise changes.   Allergies  Allergen Reactions  . Ceftin  [Cefuroxime Axetil]   . Doxycycline Hyclate   . Lodine  [Etodolac]   . Progesterone   . Augmentin  [Amoxicillin-Pot Clavulanate] Rash     Current Outpatient Medications:  .  amLODipine (NORVASC) 5 MG tablet, TAKE 1 TABLET BY MOUTH EVERY DAY, Disp: 90 tablet, Rfl: 3 .  aspirin 81 MG tablet, Take by mouth., Disp: , Rfl:  .  Blood Glucose Monitoring Suppl (ONETOUCH ULTRALINK) w/Device KIT, 1 strip by Does not apply route daily., Disp: 100 each, Rfl: 12 .  loratadine  (CLARITIN) 10 MG tablet, Take by mouth., Disp: , Rfl:  .  metFORMIN (GLUCOPHAGE) 500 MG tablet, Take 1 tablet (500 mg total) by mouth 2 (two) times daily with a meal. (Patient taking differently: Take 500 mg by mouth 1 day or 1 dose. ), Disp: 180 tablet, Rfl: 3 .  Multiple Vitamin (MULTIVITAMIN) tablet, Take 1 tablet by mouth daily., Disp: , Rfl:  .  omeprazole (PRILOSEC) 20 MG capsule, 1 CAPSULE DR, ORAL, TWO TIMES DAILY (Patient taking differently: PRN), Disp: 60 capsule, Rfl: 5 .  ONE TOUCH ULTRA TEST test strip, 1 STRIP BY DOES NOT APPLY ROUTE DAILY., Disp: , Rfl: 12 .  ONETOUCH DELICA LANCETS FINE MISC, , Disp: , Rfl:  .  TRIAMCINOLONE ACETONIDE, NASAL STEROIDS,, Place into the nose., Disp: , Rfl:  .  triamterene-hydrochlorothiazide (MAXZIDE-25) 37.5-25 MG tablet, Take 1 tablet by mouth daily., Disp: 90 tablet, Rfl: 3  Review of Systems  Constitutional: Negative for activity change, appetite change, chills, diaphoresis, fatigue, fever and unexpected weight change.  Respiratory: Negative for cough and shortness of breath.   Cardiovascular: Negative for chest pain, palpitations and leg swelling.  Endocrine: Negative for cold intolerance, heat intolerance, polydipsia, polyphagia and polyuria.  Musculoskeletal: Negative for arthralgias and myalgias.  Skin: Negative.   Neurological: Negative for dizziness, light-headedness and headaches.  Psychiatric/Behavioral: Negative.       Social History   Tobacco Use  . Smoking status: Former Smoker    Packs/day: 0.50    Years: 20.00    Pack years: 10.00    Types: Cigarettes    Last attempt to quit: 08/19/2008    Years since quitting: 9.7  . Smokeless tobacco: Never Used  Substance Use Topics  . Alcohol use: No   Objective:   BP 120/68 (BP Location: Left Arm, Patient Position: Sitting, Cuff Size: Large)   Pulse 68   Temp 98.4 F (36.9 C)   Resp 16   Ht 5' 5.5" (1.664 m)   Wt 226 lb (102.5 kg)   SpO2 97%   BMI 37.04 kg/m  Vitals:    05/25/18 0827  BP: 120/68  Pulse: 68  Resp: 16  Temp: 98.4 F (36.9 C)  SpO2: 97%  Weight: 226 lb (102.5 kg)  Height: 5' 5.5" (1.664 m)     Physical Exam  Constitutional: She is oriented to person, place, and time. She appears well-developed and well-nourished.  HENT:  Head: Normocephalic and atraumatic.  Eyes: Conjunctivae are normal. No scleral icterus.  Neck: No thyromegaly present.  Cardiovascular: Normal rate, regular rhythm and normal heart sounds.  Pulmonary/Chest: Effort normal and breath sounds normal.  Abdominal: Soft.  Musculoskeletal: She exhibits no edema.  Neurological: She is alert and oriented to person, place, and time.  Skin: Skin is warm and dry.  Psychiatric: She has a normal mood and affect. Her behavior is normal. Judgment and thought content normal.        Assessment & Plan:     1. Diabetes mellitus without complication (H[facility]) Now prediabetic with lifestyle intervention with significant weight loss with dietary changes and some exercise.  Return to clinic 4 months. - POCT glycosylated hemoglobin (Hb A1C)--6.0  2. Essential hypertension Wean Dyazide with weight loss and lifestyle changes.  3. Obesity, unspecified classification, unspecified obesity type, unspecified whether serious comorbidity present Improving.  4. Flu vaccine need  - Flu Vaccine QUAD 6+ mos PF IM (Fluarix Quad PF)        Richard Gilbert Jr, MD  [facility]  

## 2018-05-25 NOTE — Patient Instructions (Signed)
Wean off of Dyazide.

## 2018-06-09 ENCOUNTER — Encounter: Payer: Self-pay | Admitting: Dietician

## 2018-06-09 ENCOUNTER — Encounter: Payer: BC Managed Care – PPO | Attending: Family Medicine | Admitting: Dietician

## 2018-06-09 VITALS — Wt 227.8 lb

## 2018-06-09 DIAGNOSIS — E119 Type 2 diabetes mellitus without complications: Secondary | ICD-10-CM

## 2018-06-09 NOTE — Patient Instructions (Addendum)
Patient set the following goals: Exercise goal: 30 minutes daily. [place] [id number]. Resume goal of 64 oz water. Avoid snack in the evening unless dinner has been earlier. If hungry  Choose a protein and a carbohydrate- [place] yogurt is an example.

## 2018-06-09 NOTE — Progress Notes (Signed)
Medical Nutrition Therapy Follow-up visit:  Time with patient: 8:15 -8:45 am Visit #: 4 ASSESSMENT:  Diagnosis:Type 2 Diabetes  Current weight: 227.8 lbs Height:65.5 lbs Medications: See list Medical History: hypertension, Barrett's esophagus Progress and evaluation: Patient in for medical nutrition follow-up. She reports her most recent HgA1c 10/'19 was 6. She continues to monitor blood sugars with most recent results of 90's-110's. Her weight is 2.6 lbs greater than 3 months ago and she notes that she was on vacation last week and is also wearing heavier clothes which is contributing to weight. When asked, she was able to identify some of the areas she could improve on related to diet and exercise. She states she has not been walking at work during her lunch break as she was doing at her last visit. She eats "out" at lunch, usually choosing entree salads. She also states she is not drinking as much water as she was previously doing. She keeps snacks at her work desk typically fruit and sunflower seeds and will sometimes eat when she is not hungry and states she also will eat a snack after dinner even if not hungry. She states she continues to be mindful of portions at dinner meal. She states, "Now that my blood sugars are under control, I don't seem to have as much motivation regarding weight loss but I still want to lose weight."  NUTRITION CARE EDUCATION:    Weight control/Diabetes: Other lifestyle changes:   Commended on continuing to mindful of food choices and how in general she is making choices that provide nutrients. Encouraged to focus on areas she has identified; fluid intake, exercise and snacks. Discussed mindful eating verses restrictive dieting. Encouraged strategies verses will power such as keeping snacks at work in a break room refrigerator verses at her desk. Discussed how resuming exercise can give a mental boost to improving eating habits.   INTERVENTION:  Patient set  the following goals: Exercise goal: 30 minutes daily, walk at work [place] of [address] steps. Resume goal of 64 oz water daily. Avoid snack in the evening unless dinner has been earlier. If hungry, choose a protein and a carbohydrate- [place] yogurt is an example.  EDUCATION MATERIALS GIVEN:  . Goals/ instructions  LEARNER/ who was taught:  . Patient  LEVEL OF UNDERSTANDING: Verbalizes understanding  Demonstrated degree of understanding via teach back.  LEARNING BARRIERS: . None  WILLINGNESS TO LEARN/READINESS FOR CHANGE: . Acceptance, ready for change  MONITORING AND EVALUATION:   No follow-up scheduled. Patient was encouraged to call if has further questions or desires a follow-up appointment.

## 2018-06-27 ENCOUNTER — Other Ambulatory Visit: Payer: Self-pay | Admitting: Family Medicine

## 2018-06-27 DIAGNOSIS — I10 Essential (primary) hypertension: Secondary | ICD-10-CM

## 2018-10-19 ENCOUNTER — Other Ambulatory Visit: Payer: Self-pay

## 2018-10-19 ENCOUNTER — Encounter: Payer: Self-pay | Admitting: Family Medicine

## 2018-10-19 ENCOUNTER — Ambulatory Visit (INDEPENDENT_AMBULATORY_CARE_PROVIDER_SITE_OTHER): Payer: BC Managed Care – PPO | Admitting: Family Medicine

## 2018-10-19 VITALS — BP 156/90 | HR 70 | Temp 98.1°F | Ht 65.5 in | Wt 231.4 lb

## 2018-10-19 DIAGNOSIS — R7303 Prediabetes: Secondary | ICD-10-CM | POA: Diagnosis not present

## 2018-10-19 DIAGNOSIS — I10 Essential (primary) hypertension: Secondary | ICD-10-CM | POA: Diagnosis not present

## 2018-10-19 DIAGNOSIS — Z0000 Encounter for general adult medical examination without abnormal findings: Secondary | ICD-10-CM | POA: Diagnosis not present

## 2018-10-19 DIAGNOSIS — Z1211 Encounter for screening for malignant neoplasm of colon: Secondary | ICD-10-CM

## 2018-10-19 DIAGNOSIS — Z6837 Body mass index (BMI) 37.0-37.9, adult: Secondary | ICD-10-CM

## 2018-10-19 DIAGNOSIS — L659 Nonscarring hair loss, unspecified: Secondary | ICD-10-CM | POA: Diagnosis not present

## 2018-10-19 NOTE — Progress Notes (Signed)
Patient: Linda Hoffman, Female    DOB: 03-Jan-1956, 63 y.o.   MRN: [id number] Visit Date: 10/19/2018  Today's Provider: Wilhemena Durie, MD   Chief Complaint  Patient presents with  . Annual Exam  . Alopecia   Subjective:     Annual physical exam Linda Hoffman is a 63 y.o. female who presents today for health maintenance and complete physical. She feels well. She reports exercising not as much as she should but does some walking and ride bicycle. She reports she is sleeping well. She is frustrated that she is no longer losing weight but she is staying with her diet and exercise program.  She is status post hysterectomy for uterine fibroids. She does admit to some recent hair loss in her left frontal area.  It is not appreciable on exam.  -----------------------------------------------------------------   Review of Systems  Constitutional: Negative.   HENT: Negative.   Eyes: Positive for redness.  Respiratory: Negative.   Cardiovascular: Negative.   Gastrointestinal: Negative.   Endocrine: Negative.   Genitourinary: Negative.   Musculoskeletal: Negative.   Skin: Negative.   Allergic/Immunologic: Positive for environmental allergies.  Neurological: Negative.   Hematological: Negative.   Psychiatric/Behavioral: Negative.     Social History      She  reports that she quit smoking about 10 years ago. Her smoking use included cigarettes. She has a 10.00 pack-year smoking history. She has never used smokeless tobacco. She reports that she does not drink alcohol or use drugs.       Social History   Socioeconomic History  . Marital status: Married    Spouse name: Not on file  . Number of children: Not on file  . Years of education: Not on file  . Highest education level: Not on file  Occupational History  . Not on file  Social Needs  . Financial resource strain: Not on file  . Food insecurity:    Worry: Not on file    Inability: Not on file  .  Transportation needs:    Medical: Not on file    Non-medical: Not on file  Tobacco Use  . Smoking status: Former Smoker    Packs/day: 0.50    Years: 20.00    Pack years: 10.00    Types: Cigarettes    Last attempt to quit: 08/19/2008    Years since quitting: 10.1  . Smokeless tobacco: Never Used  Substance and Sexual Activity  . Alcohol use: No  . Drug use: No  . Sexual activity: Yes  Lifestyle  . Physical activity:    Days per week: Not on file    Minutes per session: Not on file  . Stress: Not on file  Relationships  . Social connections:    Talks on phone: Not on file    Gets together: Not on file    Attends religious service: Not on file    Active member of club or organization: Not on file    Attends meetings of clubs or organizations: Not on file    Relationship status: Not on file  Other Topics Concern  . Not on file  Social History Narrative  . Not on file    Past Medical History:  Diagnosis Date  . Hypertension      Patient Active Problem List   Diagnosis Date Noted  . Allergic rhinitis 02/17/2015  . Barrett's esophagus 02/17/2015  . Acid reflux 02/17/2015  . 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 BP (  high blood pressure) 02/17/2015  . Adiposity 02/17/2015  . Avitaminosis D 02/17/2015    Past Surgical History:  Procedure Laterality Date  . ABDOMINAL HYSTERECTOMY  2002   total-BSO  . CHOLECYSTECTOMY    . TRIGGER FINGER RELEASE    . UPPER GI ENDOSCOPY  11/22/08   normal    Family History        Family Status  Relation Name Status  . Mother  Deceased  . Father  Deceased at age 41  . Sister  Alive  . Brother  Alive  . Brother  Alive  . Sister  Alive  . Sister  Alive  . MGF  (Not Specified)  . PGF  (Not Specified)        Her family history includes Diabetes in her brother, father, mother, and sister; Esophageal cancer in her maternal grandfather; Heart disease in her father; Hyperlipidemia in her mother; Hypertension in her brother, brother,  mother, and sister; Kidney failure in her mother; Liver cancer in her paternal grandfather; Multiple myeloma in her father; Thyroid cancer in her brother; Thyroid disease in her mother and sister.      Allergies  Allergen Reactions  . Ceftin  [Cefuroxime Axetil]   . Doxycycline Hyclate   . Lodine  [Etodolac]   . Progesterone   . Augmentin  [Amoxicillin-Pot Clavulanate] Rash     Current Outpatient Medications:  .  amLODipine (NORVASC) 5 MG tablet, TAKE 1 TABLET BY MOUTH EVERY DAY, Disp: 90 tablet, Rfl: 3 .  aspirin 81 MG tablet, Take by mouth., Disp: , Rfl:  .  Blood Glucose Monitoring Suppl (ONETOUCH ULTRALINK) w/Device KIT, 1 strip by Does not apply route daily., Disp: 100 each, Rfl: 12 .  loratadine (CLARITIN) 10 MG tablet, Take by mouth., Disp: , Rfl:  .  metFORMIN (GLUCOPHAGE) 500 MG tablet, Take 1 tablet (500 mg total) by mouth 2 (two) times daily with a meal. (Patient taking differently: Take 500 mg by mouth 1 day or 1 dose. ), Disp: 180 tablet, Rfl: 3 .  Multiple Vitamin (MULTIVITAMIN) tablet, Take 1 tablet by mouth daily., Disp: , Rfl:  .  omeprazole (PRILOSEC) 20 MG capsule, 1 CAPSULE DR, ORAL, TWO TIMES DAILY (Patient taking differently: PRN), Disp: 60 capsule, Rfl: 5 .  ONE TOUCH ULTRA TEST test strip, 1 STRIP BY DOES NOT APPLY ROUTE DAILY., Disp: , Rfl: 12 .  ONETOUCH DELICA LANCETS FINE MISC, , Disp: , Rfl:  .  TRIAMCINOLONE ACETONIDE, NASAL STEROIDS,, Place into the nose., Disp: , Rfl:  .  triamterene-hydrochlorothiazide (MAXZIDE-25) 37.5-25 MG tablet, TAKE 1 TABLET BY MOUTH EVERY DAY, Disp: 90 tablet, Rfl: 3   Patient Care Team: Jerrol Banana., MD as PCP - General (Family Medicine)    Objective:    Vitals: BP (!) 156/90 (BP Location: Right Arm, Patient Position: Sitting, Cuff Size: Large)   Pulse 70   Temp 98.1 F (36.7 C) (Oral)   Ht 5' 5.5" (1.664 m)   Wt 231 lb 6.4 oz (105 kg)   SpO2 96%   BMI 37.92 kg/m    Vitals:   10/19/18 0905  BP: (!) 156/90    Pulse: 70  Temp: 98.1 F (36.7 C)  TempSrc: Oral  SpO2: 96%  Weight: 231 lb 6.4 oz (105 kg)  Height: 5' 5.5" (1.664 m)     Physical Exam Vitals signs reviewed.  Constitutional:      Appearance: She is well-developed.     Comments: Obese WF NAD.  HENT:  Head: Normocephalic and atraumatic.     Right Ear: External ear normal.     Left Ear: External ear normal.     Nose: Nose normal.  Eyes:     Conjunctiva/sclera: Conjunctivae normal.     Pupils: Pupils are equal, round, and reactive to light.  Neck:     Musculoskeletal: Normal range of motion and neck supple.  Cardiovascular:     Rate and Rhythm: Normal rate and regular rhythm.     Heart sounds: Normal heart sounds.  Pulmonary:     Effort: Pulmonary effort is normal.     Breath sounds: Normal breath sounds.  Chest:     Breasts:        Right: Normal.        Left: Normal.  Abdominal:     General: Bowel sounds are normal.     Palpations: Abdomen is soft.  Musculoskeletal: Normal range of motion.  Skin:    General: Skin is warm and dry.  Neurological:     Mental Status: She is alert and oriented to person, place, and time.  Psychiatric:        Behavior: Behavior normal.        Thought Content: Thought content normal.        Judgment: Judgment normal.      Depression Screen PHQ 2/9 Scores 10/19/2018 10/27/2017 10/14/2017 04/02/2017  PHQ - 2 Score 0 0 0 0       Assessment & Plan:     Routine Health Maintenance and Physical Exam  Exercise Activities and Dietary recommendations Goals   None     Immunization History  Administered Date(s) Administered  . Influenza,inj,Quad PF,6+ Mos 05/25/2018  . Tdap 10/07/2012    Health Maintenance  Topic Date Due  . Hepatitis C Screening  04-08-1956  . PNEUMOCOCCAL POLYSACCHARIDE VACCINE AGE 27-64 HIGH RISK  04/13/1960  . FOOT EXAM  04/13/1968  . URINE MICROALBUMIN  04/13/1968  . HIV Screening  04/13/1973  . PAP SMEAR-Modifier  04/12/2018  . COLONOSCOPY   08/30/2018  . HEMOGLOBIN A1C  11/24/2018  . OPHTHALMOLOGY EXAM  12/05/2018  . MAMMOGRAM  03/01/2019  . TETANUS/TDAP  10/07/2022  . INFLUENZA VACCINE  Completed     Discussed health benefits of physical activity, and encouraged her to engage in regular exercise appropriate for her age and condition.  1. Annual physical exam Insisted patient get colonoscopy this year. - CBC with Differential/Platelet - Comprehensive metabolic panel - Lipid panel - Hemoglobin A1c - TSH - MM Digital Screening; Future Status post total hysterectomy for fibroids. 2. Pre-diabetes Much improved with weight loss.  Was frankly diabetic in the past with a A1c of 10.4 - Hemoglobin A1c  3. Essential hypertension Controlled.  Need to add losartan/arb  4. Hair loss Recommended women's OTC Rogaine.  Also refer to dermatology - Ambulatory referral to Dermatology  5. Encounter for screening colonoscopy for non-high-risk patient  - Ambulatory referral to Gastroenterology  6. Class 2 severe obesity due to excess calories with serious comorbidity and body mass index (BMI) of 37.0 to 37.9 in adult [facility]) With hypertension and GERD.    --------------------------------------------------------------------   I have done the exam and reviewed the above chart and it is accurate to the best of my knowledge. Development worker, community has been used in this note in any air is in the dictation or transcription are unintentional.  Wilhemena Durie, MD  [place] [facility]

## 2018-10-20 LAB — COMPREHENSIVE METABOLIC PANEL
A/G RATIO: 1.5 (ref 1.2–2.2)
ALBUMIN: 4.4 g/dL (ref 3.8–4.9)
ALT: 17 IU/L (ref 0–32)
AST: 14 IU/L (ref 0–40)
Alkaline Phosphatase: 108 IU/L (ref 39–117)
BILIRUBIN TOTAL: 0.6 mg/dL (ref 0.0–1.2)
BUN / CREAT RATIO: 16 (ref 12–28)
BUN: 14 mg/dL (ref 8–27)
CHLORIDE: 101 mmol/L (ref 96–106)
CO2: 25 mmol/L (ref 20–29)
Calcium: 9.7 mg/dL (ref 8.7–10.3)
Creatinine, Ser: 0.87 mg/dL (ref 0.57–1.00)
GFR, EST AFRICAN AMERICAN: 84 mL/min/{1.73_m2} (ref >59)
GFR, EST NON AFRICAN AMERICAN: 73 mL/min/{1.73_m2} (ref >59)
GLOBULIN, TOTAL: 3 g/dL (ref 1.5–4.5)
Glucose: 105 mg/dL — ABNORMAL HIGH (ref 65–99)
POTASSIUM: 4.2 mmol/L (ref 3.5–5.2)
SODIUM: 141 mmol/L (ref 134–144)
TOTAL PROTEIN: 7.4 g/dL (ref 6.0–8.5)

## 2018-10-20 LAB — CBC WITH DIFFERENTIAL/PLATELET
BASOS: 1 %
Basophils Absolute: 0.1 10*3/uL (ref 0.0–0.2)
EOS (ABSOLUTE): 0.3 10*3/uL (ref 0.0–0.4)
EOS: 5 %
HEMATOCRIT: 42.5 % (ref 34.0–46.6)
Hemoglobin: 14.3 g/dL (ref 11.1–15.9)
IMMATURE GRANS (ABS): 0 10*3/uL (ref 0.0–0.1)
IMMATURE GRANULOCYTES: 0 %
LYMPHS: 28 %
Lymphocytes Absolute: 1.8 10*3/uL (ref 0.7–3.1)
MCH: 28.1 pg (ref 26.6–33.0)
MCHC: 33.6 g/dL (ref 31.5–35.7)
MCV: 84 fL (ref 79–97)
Monocytes Absolute: 0.4 10*3/uL (ref 0.1–0.9)
Monocytes: 5 %
NEUTROS ABS: 3.9 10*3/uL (ref 1.4–7.0)
Neutrophils: 61 %
PLATELETS: 293 10*3/uL (ref 150–450)
RBC: 5.08 x10E6/uL (ref 3.77–5.28)
RDW: 14.4 % (ref 11.7–15.4)
WBC: 6.5 10*3/uL (ref 3.4–10.8)

## 2018-10-20 LAB — LIPID PANEL
Chol/HDL Ratio: 4 ratio (ref 0.0–4.4)
Cholesterol, Total: 176 mg/dL (ref 100–199)
HDL: 44 mg/dL (ref >39)
LDL Calculated: 108 mg/dL — ABNORMAL HIGH (ref 0–99)
Triglycerides: 121 mg/dL (ref 0–149)
VLDL Cholesterol Cal: 24 mg/dL (ref 5–40)

## 2018-10-20 LAB — HEMOGLOBIN A1C
ESTIMATED AVERAGE GLUCOSE: 117 mg/dL
HEMOGLOBIN A1C: 5.7 % — AB (ref 4.8–5.6)

## 2018-10-20 LAB — TSH: TSH: 3.8 u[IU]/mL (ref 0.450–4.500)

## 2018-11-02 ENCOUNTER — Other Ambulatory Visit: Payer: Self-pay | Admitting: Family Medicine

## 2018-11-02 DIAGNOSIS — E119 Type 2 diabetes mellitus without complications: Secondary | ICD-10-CM

## 2019-01-28 ENCOUNTER — Other Ambulatory Visit: Payer: Self-pay | Admitting: Family Medicine

## 2019-01-28 DIAGNOSIS — I10 Essential (primary) hypertension: Secondary | ICD-10-CM

## 2019-04-21 ENCOUNTER — Ambulatory Visit: Payer: Self-pay | Admitting: Family Medicine

## 2019-05-19 ENCOUNTER — Ambulatory Visit: Payer: Self-pay | Admitting: Family Medicine

## 2019-06-22 ENCOUNTER — Ambulatory Visit: Payer: Self-pay | Admitting: Family Medicine

## 2019-07-23 NOTE — Progress Notes (Deleted)
Patient: Linda Hoffman Female    DOB: 11/24/1955   63 y.o.   MRN: [id number] Visit Date: 07/23/2019  Today's Provider: Wilhemena Durie, MD   No chief complaint on file.  Subjective:     HPI  Allergies  Allergen Reactions  . Ceftin  [Cefuroxime Axetil]   . Doxycycline Hyclate   . Lodine  [Etodolac]   . Progesterone   . Augmentin  [Amoxicillin-Pot Clavulanate] Rash     Current Outpatient Medications:  .  amLODipine (NORVASC) 5 MG tablet, TAKE 1 TABLET BY MOUTH EVERY DAY, Disp: 90 tablet, Rfl: 3 .  aspirin 81 MG tablet, Take by mouth., Disp: , Rfl:  .  Blood Glucose Monitoring Suppl (ONETOUCH ULTRALINK) w/Device KIT, 1 STRIP BY DOES NOT APPLY ROUTE DAILY., Disp: 100 each, Rfl: 12 .  loratadine (CLARITIN) 10 MG tablet, Take by mouth., Disp: , Rfl:  .  metFORMIN (GLUCOPHAGE) 500 MG tablet, Take 1 tablet (500 mg total) by mouth 2 (two) times daily with a meal. (Patient taking differently: Take 500 mg by mouth 1 day or 1 dose. ), Disp: 180 tablet, Rfl: 3 .  Multiple Vitamin (MULTIVITAMIN) tablet, Take 1 tablet by mouth daily., Disp: , Rfl:  .  omeprazole (PRILOSEC) 20 MG capsule, 1 CAPSULE DR, ORAL, TWO TIMES DAILY (Patient taking differently: PRN), Disp: 60 capsule, Rfl: 5 .  ONE TOUCH ULTRA TEST test strip, 1 STRIP BY DOES NOT APPLY ROUTE DAILY., Disp: , Rfl: 12 .  OneTouch Delica Lancets 81P MISC, USE DAILY AS DIRECTED, Disp: 100 each, Rfl: 12 .  TRIAMCINOLONE ACETONIDE, NASAL STEROIDS,, Place into the nose., Disp: , Rfl:  .  triamterene-hydrochlorothiazide (MAXZIDE-25) 37.5-25 MG tablet, TAKE 1 TABLET BY MOUTH EVERY DAY, Disp: 90 tablet, Rfl: 3  Review of Systems  Constitutional: Negative for appetite change, chills, fatigue and fever.  Respiratory: Negative for chest tightness and shortness of breath.   Cardiovascular: Negative for chest pain and palpitations.  Gastrointestinal: Negative for abdominal pain, nausea and vomiting.  Neurological: Negative for dizziness  and weakness.    Social History   Tobacco Use  . Smoking status: Former Smoker    Packs/day: 0.50    Years: 20.00    Pack years: 10.00    Types: Cigarettes    Quit date: 08/19/2008    Years since quitting: 10.9  . Smokeless tobacco: Never Used  Substance Use Topics  . Alcohol use: No      Objective:   There were no vitals taken for this visit. There were no vitals filed for this visit.There is no height or weight on file to calculate BMI.   Physical Exam   No results found for any visits on 07/29/19.     Assessment & Plan        Wilhemena Durie, MD  [facility]

## 2019-07-29 ENCOUNTER — Ambulatory Visit: Payer: Self-pay | Admitting: Family Medicine

## 2019-09-07 ENCOUNTER — Ambulatory Visit: Payer: Self-pay | Admitting: Family Medicine

## 2019-09-21 NOTE — Progress Notes (Signed)
Patient: Linda Hoffman Female    DOB: 09-Feb-1956   64 y.o.   MRN: [id number] Visit Date: 09/23/2019  Today's Provider: Wilhemena Durie, MD   Chief Complaint  Patient presents with  . prediabetes   Subjective:     HPI  Patient has done poorly with diet and exercise habits during the Covid pandemic and her weight is up 23 pounds.  She really has no complaints.  She states her blood pressure is good at home.  She does not really check her blood sugars. Prediabetes From 10/19/2018-Hemoglobin A1c 5.7 this visit. Labs stable. Still prediabetic. Doing well with this.  Essential hypertension From 10/19/2018-Controlled. Need to add losartan/arb. Labs stable. She states her systolics are less than 798 and diastolics less than 90. Obesity, unspecified classification, unspecified obesity type, unspecified whether serious comorbidity present From 10/19/2018-Improving. Labs stable.     Allergies  Allergen Reactions  . Ceftin  [Cefuroxime Axetil]   . Doxycycline Hyclate   . Lodine  [Etodolac]   . Progesterone   . Augmentin  [Amoxicillin-Pot Clavulanate] Rash     Current Outpatient Medications:  .  amLODipine (NORVASC) 5 MG tablet, TAKE 1 TABLET BY MOUTH EVERY DAY, Disp: 90 tablet, Rfl: 3 .  aspirin 81 MG tablet, Take by mouth., Disp: , Rfl:  .  Blood Glucose Monitoring Suppl (ONETOUCH ULTRALINK) w/Device KIT, 1 STRIP BY DOES NOT APPLY ROUTE DAILY., Disp: 100 each, Rfl: 12 .  loratadine (CLARITIN) 10 MG tablet, Take by mouth., Disp: , Rfl:  .  metFORMIN (GLUCOPHAGE) 500 MG tablet, Take 1 tablet (500 mg total) by mouth 2 (two) times daily with a meal. (Patient taking differently: Take 500 mg by mouth 1 day or 1 dose. ), Disp: 180 tablet, Rfl: 3 .  Multiple Vitamin (MULTIVITAMIN) tablet, Take 1 tablet by mouth daily., Disp: , Rfl:  .  omeprazole (PRILOSEC) 20 MG capsule, 1 CAPSULE DR, ORAL, TWO TIMES DAILY (Patient taking differently: PRN), Disp: 60 capsule, Rfl: 5 .  ONE TOUCH  ULTRA TEST test strip, 1 STRIP BY DOES NOT APPLY ROUTE DAILY., Disp: , Rfl: 12 .  OneTouch Delica Lancets 92J MISC, USE DAILY AS DIRECTED, Disp: 100 each, Rfl: 12 .  TRIAMCINOLONE ACETONIDE, NASAL STEROIDS,, Place into the nose., Disp: , Rfl:  .  triamterene-hydrochlorothiazide (MAXZIDE-25) 37.5-25 MG tablet, TAKE 1 TABLET BY MOUTH EVERY DAY, Disp: 90 tablet, Rfl: 3  Review of Systems  Constitutional: Negative for appetite change, chills, fatigue and fever.  HENT: Negative.   Eyes: Negative.   Respiratory: Negative for chest tightness and shortness of breath.   Cardiovascular: Negative for chest pain and palpitations.  Gastrointestinal: Negative for abdominal pain, nausea and vomiting.  Endocrine: Negative.   Neurological: Negative for dizziness and weakness.  Hematological: Negative.   Psychiatric/Behavioral: Negative.     Social History   Tobacco Use  . Smoking status: Former Smoker    Packs/day: 0.50    Years: 20.00    Pack years: 10.00    Types: Cigarettes    Quit date: 08/19/2008    Years since quitting: 11.1  . Smokeless tobacco: Never Used  Substance Use Topics  . Alcohol use: No      Objective:   There were no vitals taken for this visit. There were no vitals filed for this visit.There is no height or weight on file to calculate BMI.   Physical Exam Vitals reviewed.  Constitutional:      Appearance: She is well-developed.  HENT:     Head: Normocephalic and atraumatic.  Eyes:     General: No scleral icterus.    Conjunctiva/sclera: Conjunctivae normal.  Neck:     Thyroid: No thyromegaly.  Cardiovascular:     Rate and Rhythm: Normal rate and regular rhythm.     Heart sounds: Normal heart sounds.  Pulmonary:     Effort: Pulmonary effort is normal.     Breath sounds: Normal breath sounds.  Abdominal:     Palpations: Abdomen is soft.  Skin:    General: Skin is warm and dry.  Neurological:     General: No focal deficit present.     Mental Status: She is  alert and oriented to person, place, and time.  Psychiatric:        Mood and Affect: Mood normal.        Behavior: Behavior normal.        Thought Content: Thought content normal.        Judgment: Judgment normal.      No results found for any visits on 09/23/19.     Assessment & Plan    1. Type 2 diabetes mellitus without complication, without long-term current use of insulin (HCC) With weight gain patient back to diabetic range with A1c of 6.5.  Diet and exercise stressed.  Take Metformin as prescribed. We probably need to add a statin in the near future with an LDL of 108.  Patient wishes to see what her lipids are this year 2. Pre-diabetes  - POCT HgB A1C  3. Colon cancer screening Patient declines colonoscopy.  She will do Cologuard. - Cologuard  4. Essential hypertension Controlled on amlodipine.  5. Class 2 severe obesity due to excess calories with serious comorbidity and body mass index (BMI) of 37.0 to 37.9 in adult [facility]) Stressed the importance of diet and exercise and weight loss.     Richard Cranford Mon, MD  [facility]

## 2019-09-23 ENCOUNTER — Ambulatory Visit: Payer: BC Managed Care – PPO | Admitting: Family Medicine

## 2019-09-23 ENCOUNTER — Other Ambulatory Visit: Payer: Self-pay

## 2019-09-23 ENCOUNTER — Encounter: Payer: Self-pay | Admitting: Family Medicine

## 2019-09-23 VITALS — BP 141/83 | HR 71 | Temp 97.3°F | Wt 254.4 lb

## 2019-09-23 DIAGNOSIS — E1165 Type 2 diabetes mellitus with hyperglycemia: Secondary | ICD-10-CM

## 2019-09-23 DIAGNOSIS — Z6837 Body mass index (BMI) 37.0-37.9, adult: Secondary | ICD-10-CM

## 2019-09-23 DIAGNOSIS — E119 Type 2 diabetes mellitus without complications: Secondary | ICD-10-CM

## 2019-09-23 DIAGNOSIS — R7303 Prediabetes: Secondary | ICD-10-CM | POA: Diagnosis not present

## 2019-09-23 DIAGNOSIS — I10 Essential (primary) hypertension: Secondary | ICD-10-CM | POA: Diagnosis not present

## 2019-09-23 DIAGNOSIS — Z1211 Encounter for screening for malignant neoplasm of colon: Secondary | ICD-10-CM | POA: Diagnosis not present

## 2019-09-23 LAB — POCT GLYCOSYLATED HEMOGLOBIN (HGB A1C)
Estimated Average Glucose: 140
Hemoglobin A1C: 6.5 % — AB (ref 4.0–5.6)

## 2019-09-28 DIAGNOSIS — E1165 Type 2 diabetes mellitus with hyperglycemia: Secondary | ICD-10-CM | POA: Insufficient documentation

## 2019-10-20 ENCOUNTER — Telehealth: Payer: Self-pay

## 2019-10-20 NOTE — Telephone Encounter (Signed)
Refaxed order  

## 2019-10-20 NOTE — Telephone Encounter (Signed)
Copied from CRM [id number]. Topic: General - Other >> Oct 20, 2019 11:41 AM Fanny Bien wrote: Reason for CRM: pt called and stated that she has not received cologuard in mail yet and is wondering if she should worry. Please advise

## 2019-10-28 ENCOUNTER — Ambulatory Visit: Payer: BC Managed Care – PPO | Attending: Internal Medicine

## 2019-10-28 ENCOUNTER — Other Ambulatory Visit: Payer: Self-pay

## 2019-10-28 DIAGNOSIS — Z23 Encounter for immunization: Secondary | ICD-10-CM

## 2019-10-28 NOTE — Progress Notes (Signed)
   Covid-19 Vaccination Clinic  Name:  JUNIE AVILLA    MRN: [id number] DOB: 05-11-1956  10/28/2019  Ms. Irizarry was observed post Covid-19 immunization for 15 minutes without incident. She was provided with Vaccine Information Sheet and instruction to access the V-Safe system.   Ms. Levandoski was instructed to call 911 with any severe reactions post vaccine: Marland Kitchen Difficulty breathing  . Swelling of face and throat  . A fast heartbeat  . A bad rash all over body  . Dizziness and weakness   Immunizations Administered    Name Date Dose VIS Date Route   Pfizer COVID-19 Vaccine 10/28/2019  9:50 AM 0.3 mL 07/30/2019 Intramuscular   Manufacturer: [employer], [employer]   Lot: [id number]   NDC: [address]-5

## 2019-11-15 LAB — COLOGUARD: Cologuard: NEGATIVE

## 2019-11-17 ENCOUNTER — Telehealth: Payer: Self-pay

## 2019-11-17 NOTE — Telephone Encounter (Signed)
Patient advised.

## 2019-11-17 NOTE — Telephone Encounter (Signed)
-----   Message from Maple Hudson., MD sent at 11/17/2019 10:23 AM EDT ----- Cologuard negative.  Repeat Cologuard in 3 years or do colonoscopy then

## 2019-11-23 ENCOUNTER — Ambulatory Visit: Payer: BC Managed Care – PPO | Attending: Internal Medicine

## 2019-11-23 DIAGNOSIS — Z23 Encounter for immunization: Secondary | ICD-10-CM

## 2019-11-23 NOTE — Progress Notes (Signed)
   Covid-19 Vaccination Clinic  Name:  ROZETTA STUMPP    MRN: [id number] DOB: Nov 17, 1955  11/23/2019  Ms. Corso was observed post Covid-19 immunization for 15 minutes without incident. She was provided with Vaccine Information Sheet and instruction to access the V-Safe system.   Ms. Buerger was instructed to call 911 with any severe reactions post vaccine: Marland Kitchen Difficulty breathing  . Swelling of face and throat  . A fast heartbeat  . A bad rash all over body  . Dizziness and weakness   Immunizations Administered    Name Date Dose VIS Date Route   Pfizer COVID-19 Vaccine 11/23/2019 10:22 AM 0.3 mL 07/30/2019 Intramuscular   Manufacturer: [employer], [employer]   Lot: [id number]   NDC: [address]-7

## 2020-01-19 ENCOUNTER — Other Ambulatory Visit: Payer: Self-pay | Admitting: Family Medicine

## 2020-01-19 DIAGNOSIS — I10 Essential (primary) hypertension: Secondary | ICD-10-CM

## 2020-01-19 NOTE — Telephone Encounter (Signed)
Requested Prescriptions  Pending Prescriptions Disp Refills   amLODipine (NORVASC) 5 MG tablet [Pharmacy Med Name: AMLODIPINE BESYLATE 5 MG TAB] 90 tablet 0    Sig: TAKE 1 TABLET BY MOUTH EVERY DAY     Cardiovascular:  Calcium Channel Blockers Failed - 01/19/2020  1:20 AM      Failed - Last BP in normal range    BP Readings from Last 1 Encounters:  09/23/19 (!) 141/83         Passed - Valid encounter within last 6 months    Recent Outpatient Visits          3 months ago Type 2 diabetes mellitus without complication, without long-term current use of insulin [facility])   [facility] Maple Hudson., MD   1 year ago Annual physical exam   [facility] Maple Hudson., MD   1 year ago Diabetes mellitus without complication [facility])   [facility] Maple Hudson., MD   1 year ago Diabetes mellitus without complication [facility])   [facility] Maple Hudson., MD   2 years ago Type 2 diabetes mellitus with hyperglycemia, without long-term current use of insulin [facility])   [facility] Maple Hudson., MD

## 2020-01-26 ENCOUNTER — Encounter: Payer: Self-pay | Admitting: Family Medicine

## 2020-03-23 NOTE — Progress Notes (Deleted)
Complete physical exam   Patient: Linda Hoffman   DOB: 10-19-1955   64 y.o. Female  MRN: [id number] Visit Date: 03/28/2020  Today's healthcare provider: Wilhemena Durie, MD   No chief complaint on file.  Subjective    Linda Hoffman is a 64 y.o. female who presents today for a complete physical exam.  She reports consuming a {diet types:17450} diet. {Exercise:19826} She generally feels {well/fairly well/poorly:18703}. She reports sleeping {well/fairly well/poorly:18703}. She {does/does not:200015} have additional problems to discuss today.  HPI  ***  Past Medical History:  Diagnosis Date  . Hypertension    Past Surgical History:  Procedure Laterality Date  . ABDOMINAL HYSTERECTOMY  2002   total-BSO  . CHOLECYSTECTOMY    . TRIGGER FINGER RELEASE    . UPPER GI ENDOSCOPY  11/22/08   normal   Social History   Socioeconomic History  . Marital status: Married    Spouse name: Not on file  . Number of children: Not on file  . Years of education: Not on file  . Highest education level: Not on file  Occupational History  . Not on file  Tobacco Use  . Smoking status: Former Smoker    Packs/day: 0.50    Years: 20.00    Pack years: 10.00    Types: Cigarettes    Quit date: 08/19/2008    Years since quitting: 11.6  . Smokeless tobacco: Never Used  Substance and Sexual Activity  . Alcohol use: No  . Drug use: No  . Sexual activity: Yes  Other Topics Concern  . Not on file  Social History Narrative  . Not on file   Social Determinants of Health   Financial Resource Strain:   . Difficulty of Paying Living Expenses:   Food Insecurity:   . Worried About Charity fundraiser in the Last Year:   . Arboriculturist in the Last Year:   Transportation Needs:   . Film/video editor (Medical):   Marland Kitchen Lack of Transportation (Non-Medical):   Physical Activity:   . Days of Exercise per Week:   . Minutes of Exercise per Session:   Stress:   . Feeling of Stress :     Social Connections:   . Frequency of Communication with Friends and Family:   . Frequency of Social Gatherings with Friends and Family:   . Attends Religious Services:   . Active Member of Clubs or Organizations:   . Attends Archivist Meetings:   Marland Kitchen Marital Status:   Intimate Partner Violence:   . Fear of Current or Ex-Partner:   . Emotionally Abused:   Marland Kitchen Physically Abused:   . Sexually Abused:    Family Status  Relation Name Status  . Mother  Deceased  . Father  Deceased at age 31  . Sister  Alive  . Brother  Alive  . Brother  Alive  . Sister  Alive  . Sister  Alive  . MGF  (Not Specified)  . PGF  (Not Specified)   Family History  Problem Relation Age of Onset  . Hypertension Mother   . Hyperlipidemia Mother   . Diabetes Mother   . Kidney failure Mother   . Thyroid disease Mother   . Diabetes Father   . Heart disease Father   . Multiple myeloma Father   . Diabetes Sister   . Hypertension Sister   . Diabetes Brother   . Hypertension Brother   . Thyroid  cancer Brother   . Hypertension Brother   . Thyroid disease Sister   . Esophageal cancer Maternal Grandfather   . Liver cancer Paternal Grandfather    Allergies  Allergen Reactions  . Ceftin  [Cefuroxime Axetil]   . Doxycycline Hyclate   . Lodine  [Etodolac]   . Progesterone   . Augmentin  [Amoxicillin-Pot Clavulanate] Rash    Patient Care Team: Jerrol Banana., MD as PCP - General (Family Medicine)   Medications: Outpatient Medications Prior to Visit  Medication Sig  . amLODipine (NORVASC) 5 MG tablet TAKE 1 TABLET BY MOUTH EVERY DAY  . aspirin 81 MG tablet Take by mouth.  . Blood Glucose Monitoring Suppl (ONETOUCH ULTRALINK) w/Device KIT 1 STRIP BY DOES NOT APPLY ROUTE DAILY.  Marland Kitchen loratadine (CLARITIN) 10 MG tablet Take by mouth.  . metFORMIN (GLUCOPHAGE) 500 MG tablet Take 1 tablet (500 mg total) by mouth 2 (two) times daily with a meal. (Patient not taking: Reported on 09/23/2019)  .  Multiple Vitamin (MULTIVITAMIN) tablet Take 1 tablet by mouth daily.  Marland Kitchen omeprazole (PRILOSEC) 20 MG capsule 1 CAPSULE DR, ORAL, TWO TIMES DAILY (Patient taking differently: PRN)  . ONE TOUCH ULTRA TEST test strip 1 STRIP BY DOES NOT APPLY ROUTE DAILY.  Glory Rosebush Delica Lancets 37J MISC USE DAILY AS DIRECTED  . TRIAMCINOLONE ACETONIDE, NASAL STEROIDS, Place into the nose.  . triamterene-hydrochlorothiazide (MAXZIDE-25) 37.5-25 MG tablet TAKE 1 TABLET BY MOUTH EVERY DAY   No facility-administered medications prior to visit.    Review of Systems  {Heme  Chem  Endocrine  Serology  Results Review (optional):23779::" "}  Objective    There were no vitals taken for this visit. {Show previous vital signs (optional):23777::" "}  Physical Exam  ***  Last depression screening scores PHQ 2/9 Scores 10/19/2018 10/27/2017 10/14/2017  PHQ - 2 Score 0 0 0   Last fall risk screening Fall Risk  10/19/2018  Falls in the past year? 0  Comment -   Last Audit-C alcohol use screening Alcohol Use Disorder Test (AUDIT) 10/19/2018  1. How often do you have a drink containing alcohol? 0  2. How many drinks containing alcohol do you have on a typical day when you are drinking? 0  3. How often do you have six or more drinks on one occasion? 0  AUDIT-C Score 0   A score of 3 or more in women, and 4 or more in men indicates increased risk for alcohol abuse, EXCEPT if all of the points are from question 1   No results found for any visits on 03/28/20.  Assessment & Plan    Routine Health Maintenance and Physical Exam  Exercise Activities and Dietary recommendations Goals   None     Immunization History  Administered Date(s) Administered  . Influenza,inj,Quad PF,6+ Mos 05/25/2018, 05/06/2019  . PFIZER SARS-COV-2 Vaccination 10/28/2019, 11/23/2019  . Tdap 10/07/2012    Health Maintenance  Topic Date Due  . Hepatitis C Screening  Never done  . PNEUMOCOCCAL POLYSACCHARIDE VACCINE AGE 28-64 HIGH  RISK  Never done  . FOOT EXAM  Never done  . URINE MICROALBUMIN  Never done  . HIV Screening  Never done  . PAP SMEAR-Modifier  04/12/2018  . COLONOSCOPY  08/30/2018  . OPHTHALMOLOGY EXAM  12/05/2018  . INFLUENZA VACCINE  03/19/2020  . HEMOGLOBIN A1C  03/22/2020  . MAMMOGRAM  10/18/2020  . TETANUS/TDAP  10/07/2022  . COVID-19 Vaccine  Completed    Discussed health benefits  of physical activity, and encouraged her to engage in regular exercise appropriate for her age and condition.  ***  No follow-ups on file.     {provider attestation***:1}   Wilhemena Durie, MD  [facility] [phone] (phone) [phone] (fax)  [place]

## 2020-03-28 ENCOUNTER — Encounter: Payer: Self-pay | Admitting: Family Medicine

## 2020-04-13 ENCOUNTER — Other Ambulatory Visit: Payer: Self-pay | Admitting: Family Medicine

## 2020-04-13 DIAGNOSIS — I10 Essential (primary) hypertension: Secondary | ICD-10-CM

## 2020-04-13 NOTE — Telephone Encounter (Signed)
Requested Prescriptions  Pending Prescriptions Disp Refills   amLODipine (NORVASC) 5 MG tablet [Pharmacy Med Name: AMLODIPINE BESYLATE 5 MG TAB] 90 tablet 0    Sig: TAKE 1 TABLET BY MOUTH EVERY DAY     Cardiovascular:  Calcium Channel Blockers Failed - 04/13/2020  1:10 AM      Failed - Last BP in normal range    BP Readings from Last 1 Encounters:  09/23/19 (!) 141/83         Failed - Valid encounter within last 6 months    Recent Outpatient Visits          6 months ago Type 2 diabetes mellitus without complication, without long-term current use of insulin [facility])   [facility] Maple Hudson., MD   1 year ago Annual physical exam   [facility] Maple Hudson., MD   1 year ago Diabetes mellitus without complication [facility])   [facility] Maple Hudson., MD   2 years ago Diabetes mellitus without complication [facility])   [facility] Maple Hudson., MD   2 years ago Type 2 diabetes mellitus with hyperglycemia, without long-term current use of insulin [facility])   [facility] Maple Hudson., MD      Future Appointments            In 2 months Maple Hudson., MD [facility]           Patient has scheduled appt in 2 months.

## 2020-06-23 NOTE — Progress Notes (Signed)
I,April Miller,acting as a scribe for Wilhemena Durie, MD.,have documented all relevant documentation on the behalf of Wilhemena Durie, MD,as directed by  Wilhemena Durie, MD while in the presence of Wilhemena Durie, MD.   Complete physical exam   Patient: Linda Hoffman   DOB: 1955/08/23   64 y.o. Female  MRN: [id number] Visit Date: 06/26/2020  Today's healthcare provider: Wilhemena Durie, MD   Chief Complaint  Patient presents with   Annual Exam   Subjective    Linda Hoffman is a 64 y.o. female who presents today for a complete physical exam.  She reports consuming a general diet. The patient does not participate in regular exercise at present. She generally feels well. She reports sleeping fairly well. She does not have additional problems to discuss today.  Patient is married retiring in February.  She is under stress as her 68 year old stepdaughter had a stroke 2 months ago and now she is helping to care for her 62 year old and 59-year-old grandchildren. HPI    Past Medical History:  Diagnosis Date   Hypertension    Past Surgical History:  Procedure Laterality Date   ABDOMINAL HYSTERECTOMY  2002   total-BSO   CHOLECYSTECTOMY     TRIGGER FINGER RELEASE     UPPER GI ENDOSCOPY  11/22/08   normal   Social History   Socioeconomic History   Marital status: Married    Spouse name: Not on file   Number of children: Not on file   Years of education: Not on file   Highest education level: Not on file  Occupational History   Not on file  Tobacco Use   Smoking status: Former Smoker    Packs/day: 0.50    Years: 20.00    Pack years: 10.00    Types: Cigarettes    Quit date: 08/19/2008    Years since quitting: 11.8   Smokeless tobacco: Never Used  Substance and Sexual Activity   Alcohol use: No   Drug use: No   Sexual activity: Yes  Other Topics Concern   Not on file  Social History Narrative   Not on file   Social Determinants  of Health   Financial Resource Strain:    Difficulty of Paying Living Expenses: Not on file  Food Insecurity:    Worried About Charity fundraiser in the Last Year: Not on file   [employer] of Food in the Last Year: Not on file  Transportation Needs:    Lack of Transportation (Medical): Not on file   Lack of Transportation (Non-Medical): Not on file  Physical Activity:    Days of Exercise per Week: Not on file   Minutes of Exercise per Session: Not on file  Stress:    Feeling of Stress : Not on file  Social Connections:    Frequency of Communication with Friends and Family: Not on file   Frequency of Social Gatherings with Friends and Family: Not on file   Attends Religious Services: Not on file   Active Member of Clubs or Organizations: Not on file   Attends Archivist Meetings: Not on file   Marital Status: Not on file  Intimate Partner Violence:    Fear of Current or Ex-Partner: Not on file   Emotionally Abused: Not on file   Physically Abused: Not on file   Sexually Abused: Not on file   Family Status  Relation Name Status   Mother  Deceased  Father  Deceased at age 78   Sister  42   Brother  Alive   Brother  Alive   Sister  Alive   Sister  Alive   MGF  (Not Specified)   PGF  (Not Specified)   Family History  Problem Relation Age of Onset   Hypertension Mother    Hyperlipidemia Mother    Diabetes Mother    Kidney failure Mother    Thyroid disease Mother    Diabetes Father    Heart disease Father    Multiple myeloma Father    Diabetes Sister    Hypertension Sister    Diabetes Brother    Hypertension Brother    Thyroid cancer Brother    Hypertension Brother    Thyroid disease Sister    Esophageal cancer Maternal Grandfather    Liver cancer Paternal Grandfather    Allergies  Allergen Reactions   Ceftin  [Cefuroxime Axetil]    Doxycycline Hyclate    Lodine  [Etodolac]    Progesterone     Augmentin  [Amoxicillin-Pot Clavulanate] Rash    Patient Care Team: Jerrol Banana., MD as PCP - General (Family Medicine)   Medications: Outpatient Medications Prior to Visit  Medication Sig   amLODipine (NORVASC) 5 MG tablet TAKE 1 TABLET BY MOUTH EVERY DAY   aspirin 81 MG tablet Take by mouth.   Blood Glucose Monitoring Suppl (ONETOUCH ULTRALINK) w/Device KIT 1 STRIP BY DOES NOT APPLY ROUTE DAILY.   metFORMIN (GLUCOPHAGE) 500 MG tablet Take 1 tablet (500 mg total) by mouth 2 (two) times daily with a meal.   Multiple Vitamin (MULTIVITAMIN) tablet Take 1 tablet by mouth daily.   omeprazole (PRILOSEC) 20 MG capsule 1 CAPSULE DR, ORAL, TWO TIMES DAILY (Patient taking differently: PRN)   ONE TOUCH ULTRA TEST test strip 1 STRIP BY DOES NOT APPLY ROUTE DAILY.   OneTouch Delica Lancets 00X MISC USE DAILY AS DIRECTED   TRIAMCINOLONE ACETONIDE, NASAL STEROIDS, Place into the nose.   triamterene-hydrochlorothiazide (MAXZIDE-25) 37.5-25 MG tablet TAKE 1 TABLET BY MOUTH EVERY DAY   loratadine (CLARITIN) 10 MG tablet Take by mouth. (Patient not taking: Reported on 06/26/2020)   No facility-administered medications prior to visit.    Review of Systems  HENT: Positive for postnasal drip.   Eyes: Positive for itching.  Allergic/Immunologic: Positive for environmental allergies.  All other systems reviewed and are negative.      Objective    BP (!) 163/85 (BP Location: Right Arm, Patient Position: Sitting, Cuff Size: Large)    Pulse 75    Temp 98.2 F (36.8 C) (Oral)    Resp 16    Ht '5\' 6"'  (1.676 m)    Wt 260 lb (117.9 kg)    SpO2 99%    BMI 41.97 kg/m  BP Readings from Last 3 Encounters:  06/26/20 (!) 163/85  09/23/19 (!) 141/83  10/19/18 (!) 156/90   Wt Readings from Last 3 Encounters:  06/26/20 260 lb (117.9 kg)  09/23/19 254 lb 6.4 oz (115.4 kg)  10/19/18 231 lb 6.4 oz (105 kg)      Physical Exam Vitals reviewed.  Constitutional:      Appearance: She is  well-developed.     Comments: Obese WF NAD.  HENT:     Head: Normocephalic and atraumatic.     Right Ear: External ear normal.     Left Ear: External ear normal.     Nose: Nose normal.  Eyes:     Conjunctiva/sclera:  Conjunctivae normal.     Pupils: Pupils are equal, round, and reactive to light.  Cardiovascular:     Rate and Rhythm: Normal rate and regular rhythm.     Heart sounds: Normal heart sounds.  Pulmonary:     Effort: Pulmonary effort is normal.     Breath sounds: Normal breath sounds.  Chest:     Breasts:        Right: Normal.        Left: Normal.  Abdominal:     General: Bowel sounds are normal.     Palpations: Abdomen is soft.  Musculoskeletal:        General: Normal range of motion.     Cervical back: Normal range of motion and neck supple.  Skin:    General: Skin is warm and dry.  Neurological:     Mental Status: She is alert and oriented to person, place, and time.  Psychiatric:        Behavior: Behavior normal.        Thought Content: Thought content normal.        Judgment: Judgment normal.       Last depression screening scores PHQ 2/9 Scores 06/26/2020 10/19/2018 10/27/2017  PHQ - 2 Score 0 0 0  PHQ- 9 Score 0 - -   Last fall risk screening Fall Risk  10/19/2018  Falls in the past year? 0  Comment -   Last Audit-C alcohol use screening Alcohol Use Disorder Test (AUDIT) 06/26/2020  1. How often do you have a drink containing alcohol? 0  2. How many drinks containing alcohol do you have on a typical day when you are drinking? 0  3. How often do you have six or more drinks on one occasion? 0  AUDIT-C Score 0  Alcohol Brief Interventions/Follow-up AUDIT Score <7 follow-up not indicated   A score of 3 or more in women, and 4 or more in men indicates increased risk for alcohol abuse, EXCEPT if all of the points are from question 1   Results for orders placed or performed in visit on 06/26/20  POCT urinalysis dipstick  Result Value Ref Range   Color,  UA Yellow    Clarity, UA Clear    Glucose, UA Negative Negative   Bilirubin, UA Negative    Ketones, UA Negative    Spec Grav, UA 1.010 1.010 - 1.025   Blood, UA Negative    pH, UA 7.0 5.0 - 8.0   Protein, UA Negative Negative   Urobilinogen, UA 0.2 0.2 or 1.0 E.U./dL   Nitrite, UA Negative    Leukocytes, UA Negative Negative   Appearance Normal    Odor Normal   POCT UA - Microalbumin  Result Value Ref Range   Microalbumin Ur, POC Negative mg/L    Assessment & Plan    Routine Health Maintenance and Physical Exam  Exercise Activities and Dietary recommendations Goals   None     Immunization History  Administered Date(s) Administered   Influenza,inj,Quad PF,6+ Mos 05/25/2018, 05/06/2019, 04/22/2020   PFIZER SARS-COV-2 Vaccination 10/28/2019, 11/23/2019   Tdap 10/07/2012   Zoster Recombinat (Shingrix) 06/26/2020    Health Maintenance  Topic Date Due   Hepatitis C Screening  Never done   PNEUMOCOCCAL POLYSACCHARIDE VACCINE AGE 35-64 HIGH RISK  Never done   FOOT EXAM  Never done   HIV Screening  Never done   PAP SMEAR-Modifier  04/12/2018   COLONOSCOPY  08/30/2018   OPHTHALMOLOGY EXAM  12/05/2018   HEMOGLOBIN  A1C  03/22/2020   MAMMOGRAM  10/18/2020   URINE MICROALBUMIN  06/26/2021   TETANUS/TDAP  10/07/2022   INFLUENZA VACCINE  Completed   COVID-19 Vaccine  Completed    Discussed health benefits of physical activity, and encouraged her to engage in regular exercise appropriate for her age and condition.  1. Annual physical exam Had  Cologuard earlier this year.  Mammogram through [facility] imaging. - Lipid panel - TSH - CBC w/Diff/Platelet - Comprehensive Metabolic Panel (CMET) - Hemoglobin A1c  2. Type 2 diabetes mellitus without complication, without long-term current use of insulin (HCC)  - Lipid panel - TSH - CBC w/Diff/Platelet - Comprehensive Metabolic Panel (CMET) - Hemoglobin A1c - POCT UA - Microalbumin--Negative   3.  Essential hypertension Check blood pressure at home, follow-up in 3 to 4 months. - Lipid panel - TSH - CBC w/Diff/Platelet - Comprehensive Metabolic Panel (CMET) - Hemoglobin A1c  4. Class 2 severe obesity due to excess calories with serious comorbidity and body mass index (BMI) of 37.0 to 37.9 in adult (HCC)  - Lipid panel - TSH - CBC w/Diff/Platelet - Comprehensive Metabolic Panel (CMET) - Hemoglobin A1c  5. Screening for blood or protein in urine  - POCT urinalysis dipstick--Normal  6. Need for shingles vaccine  - Varicella-zoster vaccine IM (Shingrix)  7. Screening mammogram for breast cancer  - MM Digital Screening   Return in about 3 months (around 09/26/2020).        Roselani Grajeda Cranford Mon, MD  [facility] [phone] (phone) [phone] (fax)  [place]

## 2020-06-26 ENCOUNTER — Encounter: Payer: Self-pay | Admitting: Family Medicine

## 2020-06-26 ENCOUNTER — Other Ambulatory Visit: Payer: Self-pay

## 2020-06-26 ENCOUNTER — Ambulatory Visit (INDEPENDENT_AMBULATORY_CARE_PROVIDER_SITE_OTHER): Payer: BC Managed Care – PPO | Admitting: Family Medicine

## 2020-06-26 VITALS — BP 163/85 | HR 75 | Temp 98.2°F | Resp 16 | Ht 66.0 in | Wt 260.0 lb

## 2020-06-26 DIAGNOSIS — Z23 Encounter for immunization: Secondary | ICD-10-CM

## 2020-06-26 DIAGNOSIS — I10 Essential (primary) hypertension: Secondary | ICD-10-CM

## 2020-06-26 DIAGNOSIS — E119 Type 2 diabetes mellitus without complications: Secondary | ICD-10-CM | POA: Diagnosis not present

## 2020-06-26 DIAGNOSIS — Z1389 Encounter for screening for other disorder: Secondary | ICD-10-CM | POA: Diagnosis not present

## 2020-06-26 DIAGNOSIS — Z6837 Body mass index (BMI) 37.0-37.9, adult: Secondary | ICD-10-CM

## 2020-06-26 DIAGNOSIS — Z0000 Encounter for general adult medical examination without abnormal findings: Secondary | ICD-10-CM

## 2020-06-26 DIAGNOSIS — Z1231 Encounter for screening mammogram for malignant neoplasm of breast: Secondary | ICD-10-CM

## 2020-06-26 LAB — POCT URINALYSIS DIPSTICK
Appearance: NORMAL
Bilirubin, UA: NEGATIVE
Blood, UA: NEGATIVE
Glucose, UA: NEGATIVE
Ketones, UA: NEGATIVE
Leukocytes, UA: NEGATIVE
Nitrite, UA: NEGATIVE
Odor: NORMAL
Protein, UA: NEGATIVE
Spec Grav, UA: 1.01 (ref 1.010–1.025)
Urobilinogen, UA: 0.2 E.U./dL
pH, UA: 7 (ref 5.0–8.0)

## 2020-06-26 LAB — POCT UA - MICROALBUMIN: Microalbumin Ur, POC: NEGATIVE mg/L

## 2020-06-27 LAB — COMPREHENSIVE METABOLIC PANEL
ALT: 18 IU/L (ref 0–32)
AST: 14 IU/L (ref 0–40)
Albumin/Globulin Ratio: 1.3 (ref 1.2–2.2)
Albumin: 4.3 g/dL (ref 3.8–4.8)
Alkaline Phosphatase: 124 IU/L — ABNORMAL HIGH (ref 44–121)
BUN/Creatinine Ratio: 14 (ref 12–28)
BUN: 12 mg/dL (ref 8–27)
Bilirubin Total: 0.5 mg/dL (ref 0.0–1.2)
CO2: 25 mmol/L (ref 20–29)
Calcium: 9.4 mg/dL (ref 8.7–10.3)
Chloride: 97 mmol/L (ref 96–106)
Creatinine, Ser: 0.88 mg/dL (ref 0.57–1.00)
GFR calc Af Amer: 81 mL/min/{1.73_m2} (ref >59)
GFR calc non Af Amer: 71 mL/min/{1.73_m2} (ref >59)
Globulin, Total: 3.3 g/dL (ref 1.5–4.5)
Glucose: 124 mg/dL — ABNORMAL HIGH (ref 65–99)
Potassium: 3.9 mmol/L (ref 3.5–5.2)
Sodium: 136 mmol/L (ref 134–144)
Total Protein: 7.6 g/dL (ref 6.0–8.5)

## 2020-06-27 LAB — CBC WITH DIFFERENTIAL/PLATELET
Basophils Absolute: 0.1 10*3/uL (ref 0.0–0.2)
Basos: 1 %
EOS (ABSOLUTE): 0.3 10*3/uL (ref 0.0–0.4)
Eos: 4 %
Hematocrit: 42.6 % (ref 34.0–46.6)
Hemoglobin: 14.3 g/dL (ref 11.1–15.9)
Immature Grans (Abs): 0 10*3/uL (ref 0.0–0.1)
Immature Granulocytes: 0 %
Lymphocytes Absolute: 2.2 10*3/uL (ref 0.7–3.1)
Lymphs: 28 %
MCH: 27.9 pg (ref 26.6–33.0)
MCHC: 33.6 g/dL (ref 31.5–35.7)
MCV: 83 fL (ref 79–97)
Monocytes Absolute: 0.5 10*3/uL (ref 0.1–0.9)
Monocytes: 6 %
Neutrophils Absolute: 4.9 10*3/uL (ref 1.4–7.0)
Neutrophils: 61 %
Platelets: 303 10*3/uL (ref 150–450)
RBC: 5.12 x10E6/uL (ref 3.77–5.28)
RDW: 14.4 % (ref 11.7–15.4)
WBC: 8 10*3/uL (ref 3.4–10.8)

## 2020-06-27 LAB — LIPID PANEL
Chol/HDL Ratio: 3.9 ratio (ref 0.0–4.4)
Cholesterol, Total: 179 mg/dL (ref 100–199)
HDL: 46 mg/dL (ref >39)
LDL Chol Calc (NIH): 107 mg/dL — ABNORMAL HIGH (ref 0–99)
Triglycerides: 150 mg/dL — ABNORMAL HIGH (ref 0–149)
VLDL Cholesterol Cal: 26 mg/dL (ref 5–40)

## 2020-06-27 LAB — HEMOGLOBIN A1C
Est. average glucose Bld gHb Est-mCnc: 154 mg/dL
Hgb A1c MFr Bld: 7 % — ABNORMAL HIGH (ref 4.8–5.6)

## 2020-06-27 LAB — TSH: TSH: 4.38 u[IU]/mL (ref 0.450–4.500)

## 2020-07-03 LAB — HM MAMMOGRAPHY

## 2020-07-17 ENCOUNTER — Other Ambulatory Visit: Payer: Self-pay | Admitting: Family Medicine

## 2020-07-17 DIAGNOSIS — I10 Essential (primary) hypertension: Secondary | ICD-10-CM

## 2020-09-26 ENCOUNTER — Ambulatory Visit: Payer: Self-pay | Admitting: Family Medicine

## 2020-10-11 ENCOUNTER — Other Ambulatory Visit: Payer: Self-pay | Admitting: Family Medicine

## 2020-10-11 DIAGNOSIS — I10 Essential (primary) hypertension: Secondary | ICD-10-CM

## 2020-10-30 NOTE — Progress Notes (Deleted)
Established patient visit   Patient: Linda Hoffman   DOB: 08-11-1956   65 y.o. Female  MRN: [id number] Visit Date: 10/31/2020  Today's healthcare provider: Wilhemena Durie, MD   No chief complaint on file.  Subjective    HPI  Diabetes Mellitus Type II, follow-up  Lab Results  Component Value Date   HGBA1C 7.0 (H) 06/26/2020   HGBA1C 6.5 (A) 09/23/2019   HGBA1C 5.7 (H) 10/19/2018   Last seen for diabetes 4 months ago.  Management since then includes; labs checked showing-Diabetes slightly elevated. Recommend considering rosuvastatin 10 mg daily for cholesterol in diabetic. She reports {excellent/good/fair/poor:19665} compliance with treatment. She {is/is not:21021397} having side effects. {document side effects if present:1}  Home blood sugar records: {diabetes glucometry results:16657}  Episodes of hypoglycemia? {Yes/No:20286} {enter details if yes:1}   Current insulin regiment: {***Type 'None' if not taking insulin                                                otherwise enter complete                                                 details of insulin regiment:1} Most Recent Eye Exam: ***  --------------------------------------------------------------------------------------------------- Hypertension, follow-up  BP Readings from Last 3 Encounters:  06/26/20 (!) 163/85  09/23/19 (!) 141/83  10/19/18 (!) 156/90   Wt Readings from Last 3 Encounters:  06/26/20 260 lb (117.9 kg)  09/23/19 254 lb 6.4 oz (115.4 kg)  10/19/18 231 lb 6.4 oz (105 kg)     She was last seen for hypertension 4 months ago.  BP at that visit was 163/85. Management since that visit includes; Check blood pressure at home, follow-up in 3 to 4 months. She reports {excellent/good/fair/poor:19665} compliance with treatment. She {is/is not:9024} having side effects. {document side effects if present:1} She {is/is not:9024} exercising. She {is/is not:9024} adherent to low salt diet.    Outside blood pressures are {enter patient reported home BP, or 'not being checked':1}.  She {does/does not:200015} smoke.  Use of agents associated with hypertension: {bp agents assoc with hypertension:511::"none"}.   --------------------------------------------------------------------------------------------------- Lipid/Cholesterol, follow-up  Last Lipid Panel: Lab Results  Component Value Date   CHOL 179 06/26/2020   LDLCALC 107 (H) 06/26/2020   HDL 46 06/26/2020   TRIG 150 (H) 06/26/2020    She was last seen for this 4 months ago.  Management since that visit includes; labs checked showing-Diabetes slightly elevated. Recommend considering rosuvastatin 10 mg daily for cholesterol in diabetic. She reports {excellent/good/fair/poor:19665} compliance with treatment. She {is/is not:9024} having side effects. {document side effects if present:1} She is following a {diet:21022986} diet. Current exercise: {exercise XBLTJ:03009}  Last metabolic panel Lab Results  Component Value Date   GLUCOSE 124 (H) 06/26/2020   NA 136 06/26/2020   K 3.9 06/26/2020   BUN 12 06/26/2020   CREATININE 0.88 06/26/2020   GFRNONAA 71 06/26/2020   GFRAA 81 06/26/2020   CALCIUM 9.4 06/26/2020   AST 14 06/26/2020   ALT 18 06/26/2020   The 10-year ASCVD risk score Mikey Bussing DC Jr., et al., 2013) is: 16.6%  ---------------------------------------------------------------------------------------------------   {Show patient history (optional):23778::" "}   Medications: Outpatient Medications Prior  to Visit  Medication Sig  . amLODipine (NORVASC) 5 MG tablet TAKE 1 TABLET BY MOUTH EVERY DAY  . aspirin 81 MG tablet Take by mouth.  . Blood Glucose Monitoring Suppl (ONETOUCH ULTRALINK) w/Device KIT 1 STRIP BY DOES NOT APPLY ROUTE DAILY.  Marland Kitchen loratadine (CLARITIN) 10 MG tablet Take by mouth. (Patient not taking: Reported on 06/26/2020)  . metFORMIN (GLUCOPHAGE) 500 MG tablet Take 1 tablet (500 mg total) by  mouth 2 (two) times daily with a meal.  . Multiple Vitamin (MULTIVITAMIN) tablet Take 1 tablet by mouth daily.  Marland Kitchen omeprazole (PRILOSEC) 20 MG capsule 1 CAPSULE DR, ORAL, TWO TIMES DAILY (Patient taking differently: PRN)  . ONE TOUCH ULTRA TEST test strip 1 STRIP BY DOES NOT APPLY ROUTE DAILY.  Glory Rosebush Delica Lancets 41Y MISC USE DAILY AS DIRECTED  . TRIAMCINOLONE ACETONIDE, NASAL STEROIDS, Place into the nose.  . triamterene-hydrochlorothiazide (MAXZIDE-25) 37.5-25 MG tablet TAKE 1 TABLET BY MOUTH EVERY DAY   No facility-administered medications prior to visit.    Review of Systems  Constitutional: Negative for appetite change, chills, fatigue and fever.  Respiratory: Negative for chest tightness and shortness of breath.   Cardiovascular: Negative for chest pain and palpitations.  Gastrointestinal: Negative for abdominal pain, nausea and vomiting.  Neurological: Negative for dizziness and weakness.    {Labs  Heme  Chem  Endocrine  Serology  Results Review (optional):23779::" "}   Objective    There were no vitals taken for this visit. {Show previous vital signs (optional):23777::" "}   Physical Exam  ***  No results found for any visits on 10/31/20.  Assessment & Plan     ***  No follow-ups on file.      {provider attestation***:1}   Wilhemena Durie, MD  [facility] [phone] (phone) [phone] (fax)  [place]

## 2020-10-31 ENCOUNTER — Ambulatory Visit: Payer: Self-pay | Admitting: Family Medicine

## 2020-12-19 ENCOUNTER — Encounter: Payer: Self-pay | Admitting: Family Medicine

## 2020-12-19 ENCOUNTER — Other Ambulatory Visit: Payer: Self-pay

## 2020-12-19 ENCOUNTER — Ambulatory Visit: Payer: BC Managed Care – PPO | Admitting: Family Medicine

## 2020-12-19 VITALS — BP 135/69 | HR 67 | Temp 98.0°F | Resp 16 | Ht 65.0 in | Wt 259.0 lb

## 2020-12-19 DIAGNOSIS — I10 Essential (primary) hypertension: Secondary | ICD-10-CM

## 2020-12-19 DIAGNOSIS — E119 Type 2 diabetes mellitus without complications: Secondary | ICD-10-CM

## 2020-12-19 DIAGNOSIS — Z23 Encounter for immunization: Secondary | ICD-10-CM

## 2020-12-19 LAB — POCT GLYCOSYLATED HEMOGLOBIN (HGB A1C): Hemoglobin A1C: 7.4 % — AB (ref 4.0–5.6)

## 2020-12-19 MED ORDER — METFORMIN HCL 500 MG PO TABS: 500.0000 mg | ORAL_TABLET | Freq: Every day | ORAL | 3 refills | Status: AC

## 2020-12-19 NOTE — Progress Notes (Signed)
Established patient visit   Patient: Linda Hoffman   DOB: 1955-11-26   65 y.o. Female  MRN: [id number] Visit Date: 12/19/2020  Today's healthcare provider: Wilhemena Durie, MD   Chief Complaint  Patient presents with  . Diabetes  . Hypertension   Subjective    HPI  Is in today for follow-up.  She is on Dyazide.  She is under stress as her 42 year old daughter had a CVA recently and 2021 she is helping to take care of both of her children she retired 2 months ago. He admits to not watching her diet and no exercise. Diabetes Mellitus Type II, follow-up  Lab Results  Component Value Date   HGBA1C 7.4 (A) 12/19/2020   HGBA1C 7.0 (H) 06/26/2020   HGBA1C 6.5 (A) 09/23/2019   Last seen for diabetes 6 months ago.  Management since then includes continuing the same treatment. She reports good compliance with treatment. She is not having side effects.   Home blood sugar records: trend: stable  Episodes of hypoglycemia? No    Current insulin regiment: none Most Recent Eye Exam: 12/04/2017  Hypertension, follow-up  BP Readings from Last 3 Encounters:  12/19/20 135/69  06/26/20 (!) 163/85  09/23/19 (!) 141/83   Wt Readings from Last 3 Encounters:  12/19/20 259 lb (117.5 kg)  06/26/20 260 lb (117.9 kg)  09/23/19 254 lb 6.4 oz (115.4 kg)     She was last seen for hypertension 6 months ago.  BP at that visit was 163/85. Management since that visit includes; Check blood pressure at home, follow-up in 3 to 4 months. She reports good compliance with treatment. She is not having side effects.  She is not exercising. She is adherent to low salt diet.   Outside blood pressures are not being checked.  She does not smoke.  Use of agents associated with hypertension: none.       Medications: Outpatient Medications Prior to Visit  Medication Sig  . amLODipine (NORVASC) 5 MG tablet TAKE 1 TABLET BY MOUTH EVERY DAY  . aspirin 81 MG tablet Take by mouth.  . Blood  Glucose Monitoring Suppl (ONETOUCH ULTRALINK) w/Device KIT 1 STRIP BY DOES NOT APPLY ROUTE DAILY.  Marland Kitchen loratadine (CLARITIN) 10 MG tablet Take by mouth.  . metFORMIN (GLUCOPHAGE) 500 MG tablet Take 1 tablet (500 mg total) by mouth 2 (two) times daily with a meal.  . Multiple Vitamin (MULTIVITAMIN) tablet Take 1 tablet by mouth daily.  Marland Kitchen omeprazole (PRILOSEC) 20 MG capsule 1 CAPSULE DR, ORAL, TWO TIMES DAILY  . OneTouch Delica Lancets 50V MISC USE DAILY AS DIRECTED  . TRIAMCINOLONE ACETONIDE, NASAL STEROIDS, Place into the nose.  . triamterene-hydrochlorothiazide (MAXZIDE-25) 37.5-25 MG tablet TAKE 1 TABLET BY MOUTH EVERY DAY  . ONE TOUCH ULTRA TEST test strip 1 STRIP BY DOES NOT APPLY ROUTE DAILY.   No facility-administered medications prior to visit.    Review of Systems  Constitutional: Negative for appetite change, chills, fatigue and fever.  Respiratory: Negative for chest tightness and shortness of breath.   Cardiovascular: Negative for chest pain and palpitations.  Gastrointestinal: Negative for abdominal pain, nausea and vomiting.  Neurological: Negative for dizziness and weakness.        Objective    BP 135/69   Pulse 67   Temp 98 F (36.7 C)   Resp 16   Ht 5' 5" (1.651 m)   Wt 259 lb (117.5 kg)   BMI 43.10 kg/m  BP Readings  from Last 3 Encounters:  12/19/20 135/69  06/26/20 (!) 163/85  09/23/19 (!) 141/83   Wt Readings from Last 3 Encounters:  12/19/20 259 lb (117.5 kg)  06/26/20 260 lb (117.9 kg)  09/23/19 254 lb 6.4 oz (115.4 kg)       Physical Exam Vitals reviewed.  Constitutional:      Appearance: She is well-developed.  HENT:     Head: Normocephalic and atraumatic.  Eyes:     General: No scleral icterus.    Conjunctiva/sclera: Conjunctivae normal.  Neck:     Thyroid: No thyromegaly.  Cardiovascular:     Rate and Rhythm: Normal rate and regular rhythm.     Heart sounds: Normal heart sounds.  Pulmonary:     Effort: Pulmonary effort is normal.      Breath sounds: Normal breath sounds.  Abdominal:     Palpations: Abdomen is soft.  Skin:    General: Skin is warm and dry.  Neurological:     General: No focal deficit present.     Mental Status: She is alert and oriented to person, place, and time.  Psychiatric:        Mood and Affect: Mood normal.        Behavior: Behavior normal.        Thought Content: Thought content normal.        Judgment: Judgment normal.       Results for orders placed or performed in visit on 12/19/20  POCT glycosylated hemoglobin (Hb A1C)  Result Value Ref Range   Hemoglobin A1C 7.4 (A) 4.0 - 5.6 %   HbA1c POC (<> result, manual entry)     HbA1c, POC (prediabetic range)     HbA1c, POC (controlled diabetic range)      Assessment & Plan     1. Type 2 diabetes mellitus without complication, without long-term current use of insulin (HCC) Instead of 7.4.  Work on diet and exercise.  Restart metformin Microalbumin on next visit. - POCT glycosylated hemoglobin (Hb A1C)  2. Essential hypertension Continue amlodipine and Dyazide   3. Need for shingles vaccine  - metFORMIN (GLUCOPHAGE) 500 MG tablet; Take 1 tablet (500 mg total) by mouth daily with breakfast.  Dispense: 90 tablet; Refill: 3   No follow-ups on file.      I, Wilhemena Durie, MD, have reviewed all documentation for this visit. The documentation on 12/23/20 for the exam, diagnosis, procedures, and orders are all accurate and complete.    Caidence Kaseman Cranford Mon, MD  [facility] [phone] (phone) [phone] (fax)  [place]

## 2020-12-20 ENCOUNTER — Other Ambulatory Visit: Payer: Self-pay

## 2020-12-20 ENCOUNTER — Ambulatory Visit (INDEPENDENT_AMBULATORY_CARE_PROVIDER_SITE_OTHER): Payer: BC Managed Care – PPO

## 2020-12-20 DIAGNOSIS — Z23 Encounter for immunization: Secondary | ICD-10-CM | POA: Diagnosis not present

## 2021-01-07 ENCOUNTER — Other Ambulatory Visit: Payer: Self-pay | Admitting: Family Medicine

## 2021-01-07 DIAGNOSIS — I10 Essential (primary) hypertension: Secondary | ICD-10-CM

## 2021-01-07 NOTE — Telephone Encounter (Signed)
Requested Prescriptions  Pending Prescriptions Disp Refills  . amLODipine (NORVASC) 5 MG tablet [Pharmacy Med Name: AMLODIPINE BESYLATE 5 MG TAB] 90 tablet 0    Sig: TAKE 1 TABLET BY MOUTH EVERY DAY     Cardiovascular:  Calcium Channel Blockers Passed - 01/07/2021  9:33 AM      Passed - Last BP in normal range    BP Readings from Last 1 Encounters:  12/19/20 135/69         Passed - Valid encounter within last 6 months    Recent Outpatient Visits          2 weeks ago Type 2 diabetes mellitus without complication, without long-term current use of insulin [facility])   [facility] Maple Hudson., MD   6 months ago Annual physical exam   [facility] Maple Hudson., MD   1 year ago Type 2 diabetes mellitus without complication, without long-term current use of insulin [facility])   [facility] Maple Hudson., MD   2 years ago Annual physical exam   [facility] Maple Hudson., MD   2 years ago Diabetes mellitus without complication [facility])   [facility] Maple Hudson., MD      Future Appointments            In 3 months Maple Hudson., MD [facility]

## 2021-04-03 ENCOUNTER — Other Ambulatory Visit: Payer: Self-pay | Admitting: Family Medicine

## 2021-04-03 DIAGNOSIS — I10 Essential (primary) hypertension: Secondary | ICD-10-CM

## 2021-04-20 ENCOUNTER — Encounter: Payer: Self-pay | Admitting: Family Medicine

## 2021-04-20 ENCOUNTER — Telehealth: Payer: Self-pay

## 2021-04-20 NOTE — Telephone Encounter (Signed)
Copied from CRM [id number]. Topic: Medical Record Request - Patient ROI Request >> Apr 20, 2021 12:32 PM [name], Linda Hoffman wrote: Pt called in to get her medical records since she has moved, Pt wanted her records sent to [facility] [place].

## 2021-04-20 NOTE — Telephone Encounter (Signed)
Copied from CRM #[id number]. Topic: Medical Record Request - Patient ROI Request >> Apr 20, 2021 12:32 PM [name], Monica A wrote: Pt called in to get her medical records since she has moved, Pt wanted her records sent to [facility] [place]. 

## 2021-04-24 ENCOUNTER — Ambulatory Visit: Payer: Self-pay | Admitting: Family Medicine

## 2021-04-24 NOTE — Telephone Encounter (Signed)
Sent mychart message to patient that she would need to sign a release at [facility] and have it faxed here in order to have her records transferred.
